# Patient Record
Sex: Female | Born: 1995 | Race: White | Hispanic: No | Marital: Married | Smoking: Never smoker
Health system: Southern US, Community
[De-identification: ages and names within clinical notes are randomized; demographics above are authoritative.]

## PROBLEM LIST (undated history)

## (undated) ENCOUNTER — Inpatient Hospital Stay: Payer: Self-pay

## (undated) DIAGNOSIS — F419 Anxiety disorder, unspecified: Secondary | ICD-10-CM

## (undated) DIAGNOSIS — M419 Scoliosis, unspecified: Secondary | ICD-10-CM

## (undated) DIAGNOSIS — M1990 Unspecified osteoarthritis, unspecified site: Secondary | ICD-10-CM

## (undated) DIAGNOSIS — G43909 Migraine, unspecified, not intractable, without status migrainosus: Secondary | ICD-10-CM

## (undated) DIAGNOSIS — G90A Postural orthostatic tachycardia syndrome (POTS): Secondary | ICD-10-CM

## (undated) DIAGNOSIS — I498 Other specified cardiac arrhythmias: Secondary | ICD-10-CM

## (undated) DIAGNOSIS — M5431 Sciatica, right side: Secondary | ICD-10-CM

## (undated) DIAGNOSIS — O99019 Anemia complicating pregnancy, unspecified trimester: Secondary | ICD-10-CM

## (undated) HISTORY — PX: ABDOMINAL HYSTERECTOMY: SHX81

## (undated) HISTORY — PX: NO PAST SURGERIES: SHX2092

---
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
## 2017-01-29 ENCOUNTER — Ambulatory Visit: Admission: RE | Admit: 2017-01-29 | Payer: Medicaid Other | Source: Ambulatory Visit

## 2017-01-29 ENCOUNTER — Other Ambulatory Visit: Payer: Self-pay | Admitting: Medical Oncology

## 2017-01-29 DIAGNOSIS — R102 Pelvic and perineal pain: Secondary | ICD-10-CM

## 2017-02-16 NOTE — L&D Delivery Note (Signed)
Date of delivery: 01/03/18 Estimated Date of Delivery: 01/19/18 Patient's last menstrual period was 04/14/2017 (approximate). EGA: [redacted]w[redacted]d  Delivery Note At 11:17 PM a viable female was delivered via Vaginal, Spontaneous (Presentation:cephalic; LOA).  APGAR: 8, 9; weight pending.   Placenta status: spontaneous, intact.  Cord: 3vv with the following complications: none apparent.  Cord pH: not collected  Anesthesia:  epidural Episiotomy: None Lacerations: None Suture Repair: n/a Est. Blood Loss (mL): 120cc (measured)  Mom presented to L&D with rupture of membranes, augmented with pitocin.  epidual placed. Progressed to complete, second stage: 2 pushes  delivery of fetal head with restitution to LOT.   Anterior then posterior shoulders delivered without difficulty.  Baby placed on mom's chest, and attended to by peds.  Cord was then clamped and cut by FOB when pulseless.  Placenta spontaneously delivered, intact.   IV pitocin given for hemorrhage prophylaxis.  We sang happy birthday to baby Kae Heller.    Mom to postpartum.  Baby to Couplet care / Skin to Skin.  Payslie Mccaig C Viet Kemmerer 01/03/2018, 11:45 PM

## 2017-02-19 ENCOUNTER — Other Ambulatory Visit: Payer: Self-pay | Admitting: Medical Oncology

## 2017-02-19 DIAGNOSIS — R102 Pelvic and perineal pain: Secondary | ICD-10-CM

## 2017-05-18 ENCOUNTER — Other Ambulatory Visit: Payer: Self-pay | Admitting: Medical Oncology

## 2017-05-18 ENCOUNTER — Ambulatory Visit
Admission: RE | Admit: 2017-05-18 | Discharge: 2017-05-18 | Disposition: A | Discharge status: Home / Self Care | Payer: Medicaid Other | Source: Ambulatory Visit | Attending: Medical Oncology | Admitting: Medical Oncology

## 2017-05-18 DIAGNOSIS — N912 Amenorrhea, unspecified: Secondary | ICD-10-CM

## 2017-05-18 DIAGNOSIS — R102 Pelvic and perineal pain: Secondary | ICD-10-CM | POA: Insufficient documentation

## 2017-05-18 DIAGNOSIS — R9389 Abnormal findings on diagnostic imaging of other specified body structures: Secondary | ICD-10-CM | POA: Diagnosis not present

## 2017-05-26 ENCOUNTER — Other Ambulatory Visit: Payer: Self-pay | Admitting: Medical Oncology

## 2017-05-26 DIAGNOSIS — Z3201 Encounter for pregnancy test, result positive: Secondary | ICD-10-CM

## 2017-05-26 DIAGNOSIS — R1031 Right lower quadrant pain: Secondary | ICD-10-CM

## 2017-06-03 ENCOUNTER — Ambulatory Visit
Admission: RE | Admit: 2017-06-03 | Discharge: 2017-06-03 | Disposition: A | Discharge status: Home / Self Care | Payer: Medicaid Other | Source: Ambulatory Visit | Attending: Medical Oncology | Admitting: Medical Oncology

## 2017-06-10 ENCOUNTER — Ambulatory Visit
Admission: RE | Admit: 2017-06-10 | Discharge: 2017-06-10 | Disposition: A | Discharge status: Home / Self Care | Payer: Medicaid Other | Source: Ambulatory Visit | Attending: Medical Oncology | Admitting: Medical Oncology

## 2017-06-10 DIAGNOSIS — Z3689 Encounter for other specified antenatal screening: Secondary | ICD-10-CM | POA: Diagnosis not present

## 2017-06-10 DIAGNOSIS — R1031 Right lower quadrant pain: Secondary | ICD-10-CM | POA: Diagnosis present

## 2017-06-10 DIAGNOSIS — Z3A01 Less than 8 weeks gestation of pregnancy: Secondary | ICD-10-CM | POA: Insufficient documentation

## 2017-06-10 DIAGNOSIS — Z3687 Encounter for antenatal screening for uncertain dates: Secondary | ICD-10-CM | POA: Diagnosis present

## 2017-06-10 DIAGNOSIS — O26891 Other specified pregnancy related conditions, first trimester: Secondary | ICD-10-CM | POA: Diagnosis not present

## 2017-06-10 DIAGNOSIS — Z3201 Encounter for pregnancy test, result positive: Secondary | ICD-10-CM | POA: Diagnosis present

## 2017-06-28 LAB — OB RESULTS CONSOLE GC/CHLAMYDIA
Chlamydia: NEGATIVE
GC PROBE AMP, GENITAL: NEGATIVE

## 2017-06-28 LAB — OB RESULTS CONSOLE HIV ANTIBODY (ROUTINE TESTING): HIV: NONREACTIVE

## 2017-06-28 LAB — OB RESULTS CONSOLE RPR: RPR: NONREACTIVE

## 2017-06-28 LAB — OB RESULTS CONSOLE VARICELLA ZOSTER ANTIBODY, IGG: Varicella: IMMUNE

## 2017-06-28 LAB — OB RESULTS CONSOLE RUBELLA ANTIBODY, IGM: RUBELLA: IMMUNE

## 2017-06-28 LAB — OB RESULTS CONSOLE HEPATITIS B SURFACE ANTIGEN: Hepatitis B Surface Ag: NEGATIVE

## 2017-06-29 ENCOUNTER — Other Ambulatory Visit: Payer: Self-pay | Admitting: Obstetrics and Gynecology

## 2017-06-29 DIAGNOSIS — Z369 Encounter for antenatal screening, unspecified: Secondary | ICD-10-CM

## 2017-07-15 ENCOUNTER — Ambulatory Visit: Payer: Medicaid Other

## 2017-07-15 ENCOUNTER — Ambulatory Visit: Payer: Medicaid Other | Attending: Obstetrics and Gynecology

## 2017-08-06 ENCOUNTER — Other Ambulatory Visit: Payer: Self-pay | Admitting: Obstetrics & Gynecology

## 2017-08-06 DIAGNOSIS — R1031 Right lower quadrant pain: Secondary | ICD-10-CM

## 2017-08-20 ENCOUNTER — Ambulatory Visit
Admission: RE | Admit: 2017-08-20 | Discharge: 2017-08-20 | Disposition: A | Discharge status: Home / Self Care | Payer: Medicaid Other | Source: Ambulatory Visit | Attending: Obstetrics & Gynecology | Admitting: Obstetrics & Gynecology

## 2017-08-20 ENCOUNTER — Other Ambulatory Visit: Payer: Self-pay | Admitting: Obstetrics & Gynecology

## 2017-08-20 DIAGNOSIS — R1031 Right lower quadrant pain: Secondary | ICD-10-CM

## 2017-08-20 DIAGNOSIS — Z3A00 Weeks of gestation of pregnancy not specified: Secondary | ICD-10-CM | POA: Diagnosis not present

## 2017-08-20 DIAGNOSIS — O26899 Other specified pregnancy related conditions, unspecified trimester: Secondary | ICD-10-CM | POA: Insufficient documentation

## 2017-09-30 ENCOUNTER — Other Ambulatory Visit: Payer: Self-pay | Admitting: Certified Nurse Midwife

## 2017-09-30 DIAGNOSIS — O09299 Supervision of pregnancy with other poor reproductive or obstetric history, unspecified trimester: Secondary | ICD-10-CM

## 2017-10-04 ENCOUNTER — Ambulatory Visit: Payer: Medicaid Other

## 2017-10-11 ENCOUNTER — Ambulatory Visit: Payer: Medicaid Other

## 2017-10-11 ENCOUNTER — Institutional Professional Consult (permissible substitution): Payer: Medicaid Other

## 2017-10-11 ENCOUNTER — Ambulatory Visit: Payer: Medicaid Other | Attending: Certified Nurse Midwife

## 2017-10-17 ENCOUNTER — Observation Stay
Admission: EM | Admit: 2017-10-17 | Discharge: 2017-10-17 | Disposition: A | Discharge status: Home / Self Care | Payer: Medicaid Other | Attending: Obstetrics and Gynecology | Admitting: Obstetrics and Gynecology

## 2017-10-17 ENCOUNTER — Encounter: Payer: Self-pay | Admitting: *Deleted

## 2017-10-17 ENCOUNTER — Other Ambulatory Visit: Payer: Self-pay

## 2017-10-17 DIAGNOSIS — F419 Anxiety disorder, unspecified: Secondary | ICD-10-CM | POA: Diagnosis not present

## 2017-10-17 DIAGNOSIS — Z79899 Other long term (current) drug therapy: Secondary | ICD-10-CM | POA: Diagnosis not present

## 2017-10-17 DIAGNOSIS — Z3A26 26 weeks gestation of pregnancy: Secondary | ICD-10-CM | POA: Insufficient documentation

## 2017-10-17 DIAGNOSIS — O99342 Other mental disorders complicating pregnancy, second trimester: Secondary | ICD-10-CM | POA: Insufficient documentation

## 2017-10-17 DIAGNOSIS — O26899 Other specified pregnancy related conditions, unspecified trimester: Secondary | ICD-10-CM | POA: Diagnosis present

## 2017-10-17 DIAGNOSIS — R300 Dysuria: Secondary | ICD-10-CM

## 2017-10-17 DIAGNOSIS — O2342 Unspecified infection of urinary tract in pregnancy, second trimester: Secondary | ICD-10-CM | POA: Diagnosis present

## 2017-10-17 LAB — URINALYSIS, ROUTINE W REFLEX MICROSCOPIC
Bacteria, UA: NONE SEEN
Bilirubin Urine: NEGATIVE
GLUCOSE, UA: NEGATIVE mg/dL
Hgb urine dipstick: NEGATIVE
KETONES UR: NEGATIVE mg/dL
Nitrite: NEGATIVE
PROTEIN: 30 mg/dL — AB
Specific Gravity, Urine: 1.027 (ref 1.005–1.030)
pH: 6 (ref 5.0–8.0)

## 2017-10-17 MED ORDER — TERBUTALINE SULFATE 1 MG/ML IJ SOLN: 0.2500 mg | Freq: Once | INTRAMUSCULAR | Status: DC

## 2017-10-17 NOTE — Discharge Summary (Signed)
Patient ID: Angela Levy, female   DOB: 03-27-1995, 22 y.o.   MRN: [id number]  Subjective   Angela Levy is a 22 y.o. female. She is at [redacted]w[redacted]d gestation. Patient's last menstrual period was 04/14/2017 (approximate). Estimated Date of Delivery: 01/19/18  Prenatal care site: [facility]   Chief complaint:c/o dysuria and inability to empty bladder starting Thursday. UA in office not c/w UTI  Some left flank pain . No fever , N/V      Maternal Medical History:       Past Medical History:  Diagnosis Date  . Anxiety   . Migraines   . Scoliosis          Past Surgical History:  Procedure Laterality Date  . NO PAST SURGERIES      No Known Allergies         Prior to Admission medications   Medication Sig Start Date End Date Taking? Authorizing Provider  Prenatal Vit-Fe Fumarate-FA (PRENATAL MULTIVITAMIN) TABS tablet Take 1 tablet by mouth daily at 12 noon. 06/16/16  Yes Coburn Knaus, [name], MD  benzocaine-Menthol (DERMOPLAST) 20-0.5 % AERO Apply 1 application topically 4 (four) times daily as needed for irritation. Patient not taking: Reported on 10/17/2017 06/16/16   Diamantina Edinger, [name], MD  docusate sodium (COLACE) 100 MG capsule Take 1 capsule (100 mg total) by mouth 2 (two) times daily. Patient not taking: Reported on 10/17/2017 06/16/16   Elie Gragert, [name], MD  HYDROcodone-acetaminophen (NORCO) 5-325 MG tablet Take 1 tablet by mouth every 6 (six) hours as needed for moderate pain. Patient not taking: Reported on 10/17/2017 06/16/16   Amyiah Gaba, [name], MD  ibuprofen (ADVIL,MOTRIN) 600 MG tablet Take 1 tablet (600 mg total) by mouth every 6 (six) hours. Patient not taking: Reported on 10/17/2017 06/16/16   Almee Pelphrey, [name], MD  norethindrone (ORTHO MICRONOR) 0.35 MG tablet Take 1 tablet (0.35 mg total) by mouth daily. Patient not taking: Reported on 10/17/2017 06/16/16   Mumin Denomme, [name], MD     Social History: She  reports  that she has never smoked. She has never used smokeless tobacco. She reports that she does not drink alcohol or use drugs.  Family History: family history is not on file.  no history of gyn cancers  Review of Systems: A full review of systems was performed and negative except as noted in the HPI.  Review of Systems: A full review of systems was performed and negativeexcept as noted in the HPI.  Eyes: no vision change  Ears: left ear pain  Oropharynx: no sore throat  Pulmonary . No shortness of breath , no hemoptysis Cardiovascular: no chest pain , no irregular heart beat  Gastrointestinal:no blood in stool . No diarrhea, no constipation Uro gynecologic: +dysuria , no pelvic pain Neurologic : no seizure , no migraines  Musculoskeletal: no muscular weakness  O:  Objective   BP (!) 103/59 (BP Location: Left Arm)   Pulse 92   Temp 98.5 F (36.9 C) (Oral)   Resp 16   Ht 5\' 7"  (1.702 m)   Wt 63 kg   LMP 04/14/2017 (Approximate)   BMI 21.77 kg/m       Results for orders placed or performed during the hospital encounter of 10/17/17 (from the past 48 hour(s))  Urinalysis, Routine w reflex microscopic   Collection Time: 10/17/17  4:02 PM  Result Value Ref Range   Color, Urine YELLOW (A) YELLOW   APPearance CLEAR (A) CLEAR   Specific Gravity,  Urine 1.027 1.005 - 1.030   pH 6.0 5.0 - 8.0   Glucose, UA NEGATIVE NEGATIVE mg/dL   Hgb urine dipstick NEGATIVE NEGATIVE   Bilirubin Urine NEGATIVE NEGATIVE   Ketones, ur NEGATIVE NEGATIVE mg/dL   Protein, ur 30 (A) NEGATIVE mg/dL   Nitrite NEGATIVE NEGATIVE   Leukocytes, UA TRACE (A) NEGATIVE   RBC / HPF 0-5 0 - 5 RBC/hpf   WBC, UA 21-50 0 - 5 WBC/hpf   Bacteria, UA NONE SEEN NONE SEEN   Squamous Epithelial / LPF 0-5 0 - 5   Mucus PRESENT     Constitutional: NAD, AAOx3  HE/ENT: extraocular movements grossly intact, moist mucous membranes CV: RRR PULM: nl respiratory effort, CTABL                                          Abd: gravid, non-tender, non-distended, soft                                                  Ext: Non-tender, Nonedmeatous                     Psych: mood appropriate, speech normal Pelvic deferred  NST: reassuring 26 weeks  t Time 15mins  A/P: 22 y.o. [redacted]w[redacted]d here for antenatal surveillance for dysuria UTI -----, no evidence of pyelonephritis   Start Macrobid bid x 7 days   Urine culture   RTC if fever increasing flank pain     Laverta Baltimore, MD Attending Obstetrician and Gynecologist [facility], Department of [place]

## 2017-10-17 NOTE — OB Triage Note (Signed)
CO having a UTI. Symptoms started Thursday with "blood in urine". Was at office on Thursday and urine was sent. Since then pain started in L flank. CO feeling like she needs to urinate constantly and burning with urination. Angela Levy

## 2017-10-17 NOTE — Progress Notes (Signed)
Patient ID: Angela Levy, female   DOB: 03-12-1995, 22 y.o.   MRN: [id number]  Angela Levy is a 22 y.o. female. She is at [redacted]w[redacted]d gestation. Patient's last menstrual period was 04/14/2017 (approximate). Estimated Date of Delivery: 01/19/18  Prenatal care site: [facility]   Chief complaint:c/o dysuria and inability to empty bladder starting Thursday. UA in office not c/w UTI  Some left flank pain . No fever , N/V      Maternal Medical History:   Past Medical History:  Diagnosis Date  . Anxiety   . Migraines   . Scoliosis     Past Surgical History:  Procedure Laterality Date  . NO PAST SURGERIES      No Known Allergies  Prior to Admission medications   Medication Sig Start Date End Date Taking? Authorizing Provider  Prenatal Vit-Fe Fumarate-FA (PRENATAL MULTIVITAMIN) TABS tablet Take 1 tablet by mouth daily at 12 noon. 06/16/16  Yes Blakeley Scheier, [name], MD  benzocaine-Menthol (DERMOPLAST) 20-0.5 % AERO Apply 1 application topically 4 (four) times daily as needed for irritation. Patient not taking: Reported on 10/17/2017 06/16/16   Donny Heffern, [name], MD  docusate sodium (COLACE) 100 MG capsule Take 1 capsule (100 mg total) by mouth 2 (two) times daily. Patient not taking: Reported on 10/17/2017 06/16/16   Holli Rengel, [name], MD  HYDROcodone-acetaminophen (NORCO) 5-325 MG tablet Take 1 tablet by mouth every 6 (six) hours as needed for moderate pain. Patient not taking: Reported on 10/17/2017 06/16/16   Adler Alton, [name], MD  ibuprofen (ADVIL,MOTRIN) 600 MG tablet Take 1 tablet (600 mg total) by mouth every 6 (six) hours. Patient not taking: Reported on 10/17/2017 06/16/16   Lynix Bonine, [name], MD  norethindrone (ORTHO MICRONOR) 0.35 MG tablet Take 1 tablet (0.35 mg total) by mouth daily. Patient not taking: Reported on 10/17/2017 06/16/16   Zakai Gonyea, [name], MD     Social History: She  reports that she has never smoked. She has never used smokeless tobacco. She  reports that she does not drink alcohol or use drugs.  Family History: family history is not on file.  no history of gyn cancers  Review of Systems: A full review of systems was performed and negative except as noted in the HPI.  Review of Systems: A full review of systems was performed and negative except as noted in the HPI.   Eyes: no vision change  Ears: left ear pain  Oropharynx: no sore throat  Pulmonary . No shortness of breath , no hemoptysis Cardiovascular: no chest pain , no irregular heart beat  Gastrointestinal:no blood in stool . No diarrhea, no constipation Uro gynecologic: +dysuria , no pelvic pain Neurologic : no seizure , no migraines    Musculoskeletal: no muscular weakness  O:  BP (!) 103/59 (BP Location: Left Arm)   Pulse 92   Temp 98.5 F (36.9 C) (Oral)   Resp 16   Ht 5\' 7"  (1.702 m)   Wt 63 kg   LMP 04/14/2017 (Approximate)   BMI 21.77 kg/m  Results for orders placed or performed during the hospital encounter of 10/17/17 (from the past 48 hour(s))  Urinalysis, Routine w reflex microscopic   Collection Time: 10/17/17  4:02 PM  Result Value Ref Range   Color, Urine YELLOW (A) YELLOW   APPearance CLEAR (A) CLEAR   Specific Gravity, Urine 1.027 1.005 - 1.030   pH 6.0 5.0 - 8.0   Glucose, UA NEGATIVE NEGATIVE mg/dL   Hgb urine  dipstick NEGATIVE NEGATIVE   Bilirubin Urine NEGATIVE NEGATIVE   Ketones, ur NEGATIVE NEGATIVE mg/dL   Protein, ur 30 (A) NEGATIVE mg/dL   Nitrite NEGATIVE NEGATIVE   Leukocytes, UA TRACE (A) NEGATIVE   RBC / HPF 0-5 0 - 5 RBC/hpf   WBC, UA 21-50 0 - 5 WBC/hpf   Bacteria, UA NONE SEEN NONE SEEN   Squamous Epithelial / LPF 0-5 0 - 5   Mucus PRESENT      Constitutional: NAD, AAOx3  HE/ENT: extraocular movements grossly intact, moist mucous membranes CV: RRR PULM: nl respiratory effort, CTABL     Abd: gravid, non-tender, non-distended, soft      Ext: Non-tender, Nonedmeatous   Psych: mood appropriate, speech normal Pelvic  deferred  NST: reassuring 26 weeks  t Time 45mins  A/P: 22 y.o. [redacted]w[redacted]d here for antenatal surveillance for dysuria UTI -----, no evidence of pyelonephritis   Start Macrobid bid x 7 days   Urine culture   RTC if fever increasing flank pain     Laverta Baltimore, MD Attending Obstetrician and Gynecologist [facility], Department of [facility]

## 2017-10-20 LAB — URINE CULTURE: Special Requests: NORMAL

## 2018-01-02 ENCOUNTER — Observation Stay
Admission: EM | Admit: 2018-01-02 | Discharge: 2018-01-03 | Disposition: A | Discharge status: Home / Self Care | Payer: Medicaid Other | Source: Home / Self Care | Admitting: Obstetrics and Gynecology

## 2018-01-02 ENCOUNTER — Other Ambulatory Visit: Payer: Self-pay

## 2018-01-02 DIAGNOSIS — O471 False labor at or after 37 completed weeks of gestation: Secondary | ICD-10-CM | POA: Insufficient documentation

## 2018-01-02 DIAGNOSIS — Z3A37 37 weeks gestation of pregnancy: Secondary | ICD-10-CM | POA: Insufficient documentation

## 2018-01-02 NOTE — OB Triage Note (Signed)
Patient arrived in triage with c/o leaking of fluid since 2145, clear with pink tinge, no odor. Reports good fetal movement. Denies contractions or vaginal bleeding. EFM applied and assessing.

## 2018-01-03 ENCOUNTER — Inpatient Hospital Stay: Payer: Medicaid Other | Admitting: Anesthesiology

## 2018-01-03 ENCOUNTER — Other Ambulatory Visit: Payer: Self-pay

## 2018-01-03 ENCOUNTER — Inpatient Hospital Stay
Admission: EM | Admit: 2018-01-03 | Discharge: 2018-01-05 | DRG: 806 | Disposition: A | Discharge status: Home / Self Care | Payer: Medicaid Other | Attending: Obstetrics & Gynecology | Admitting: Obstetrics & Gynecology

## 2018-01-03 DIAGNOSIS — O4292 Full-term premature rupture of membranes, unspecified as to length of time between rupture and onset of labor: Principal | ICD-10-CM | POA: Diagnosis present

## 2018-01-03 DIAGNOSIS — Z6791 Unspecified blood type, Rh negative: Secondary | ICD-10-CM | POA: Diagnosis not present

## 2018-01-03 DIAGNOSIS — O26893 Other specified pregnancy related conditions, third trimester: Secondary | ICD-10-CM | POA: Diagnosis present

## 2018-01-03 DIAGNOSIS — D62 Acute posthemorrhagic anemia: Secondary | ICD-10-CM | POA: Diagnosis not present

## 2018-01-03 DIAGNOSIS — O9081 Anemia of the puerperium: Secondary | ICD-10-CM | POA: Diagnosis not present

## 2018-01-03 DIAGNOSIS — Z3A37 37 weeks gestation of pregnancy: Secondary | ICD-10-CM | POA: Diagnosis not present

## 2018-01-03 LAB — CBC WITH DIFFERENTIAL/PLATELET
Abs Immature Granulocytes: 0.05 10*3/uL (ref 0.00–0.07)
Basophils Absolute: 0 10*3/uL (ref 0.0–0.1)
Basophils Relative: 0 %
EOS ABS: 0 10*3/uL (ref 0.0–0.5)
EOS PCT: 0 %
HCT: 33.1 % — ABNORMAL LOW (ref 36.0–46.0)
Hemoglobin: 10 g/dL — ABNORMAL LOW (ref 12.0–15.0)
Immature Granulocytes: 1 %
LYMPHS PCT: 16 %
Lymphs Abs: 1.6 10*3/uL (ref 0.7–4.0)
MCH: 21.6 pg — ABNORMAL LOW (ref 26.0–34.0)
MCHC: 30.2 g/dL (ref 30.0–36.0)
MCV: 71.6 fL — ABNORMAL LOW (ref 80.0–100.0)
Monocytes Absolute: 0.7 10*3/uL (ref 0.1–1.0)
Monocytes Relative: 7 %
NEUTROS PCT: 76 %
NRBC: 0 % (ref 0.0–0.2)
Neutro Abs: 7.7 10*3/uL (ref 1.7–7.7)
Platelets: 187 10*3/uL (ref 150–400)
RBC: 4.62 MIL/uL (ref 3.87–5.11)
RDW: 16.3 % — AB (ref 11.5–15.5)
WBC: 10.1 10*3/uL (ref 4.0–10.5)

## 2018-01-03 MED ORDER — LIDOCAINE HCL (PF) 1 % IJ SOLN: 30.0000 mL | INTRAMUSCULAR | Status: DC | PRN

## 2018-01-03 MED ORDER — LIDOCAINE HCL (PF) 1 % IJ SOLN
INTRAMUSCULAR | Status: AC
  Filled 2018-01-03: qty 30

## 2018-01-03 MED ORDER — FENTANYL 2.5 MCG/ML W/ROPIVACAINE 0.15% IN NS 100 ML EPIDURAL (ARMC)
EPIDURAL | Status: AC
  Filled 2018-01-03: qty 100

## 2018-01-03 MED ORDER — EPHEDRINE 5 MG/ML INJ
10.0000 mg | INTRAVENOUS | Status: DC | PRN
  Filled 2018-01-03: qty 2

## 2018-01-03 MED ORDER — ACETAMINOPHEN 500 MG PO TABS
1000.0000 mg | ORAL_TABLET | Freq: Four times a day (QID) | ORAL | Status: DC | PRN
  Administered 2018-01-04: 1000 mg via ORAL
  Filled 2018-01-03: qty 2

## 2018-01-03 MED ORDER — OXYTOCIN 40 UNITS IN LACTATED RINGERS INFUSION - SIMPLE MED
INTRAVENOUS | Status: AC
  Administered 2018-01-03: 4 m[IU]/min via INTRAVENOUS
  Filled 2018-01-03: qty 1000

## 2018-01-03 MED ORDER — OXYTOCIN 40 UNITS IN LACTATED RINGERS INFUSION - SIMPLE MED
1.0000 m[IU]/min | INTRAVENOUS | Status: DC
  Administered 2018-01-03: 4 m[IU]/min via INTRAVENOUS

## 2018-01-03 MED ORDER — TERBUTALINE SULFATE 1 MG/ML IJ SOLN: 0.2500 mg | Freq: Once | INTRAMUSCULAR | Status: DC | PRN

## 2018-01-03 MED ORDER — BUPIVACAINE HCL (PF) 0.25 % IJ SOLN
INTRAMUSCULAR | Status: DC | PRN
  Administered 2018-01-03: 10 mL via EPIDURAL
  Administered 2018-01-03: 5 mL via EPIDURAL

## 2018-01-03 MED ORDER — LACTATED RINGERS IV SOLN
500.0000 mL | INTRAVENOUS | Status: DC | PRN
  Administered 2018-01-03: 1000 mL via INTRAVENOUS

## 2018-01-03 MED ORDER — OXYTOCIN BOLUS FROM INFUSION
500.0000 mL | Freq: Once | INTRAVENOUS | Status: AC
  Administered 2018-01-03: 500 mL via INTRAVENOUS

## 2018-01-03 MED ORDER — ONDANSETRON HCL 4 MG/2ML IJ SOLN: 4.0000 mg | Freq: Four times a day (QID) | INTRAMUSCULAR | Status: DC | PRN

## 2018-01-03 MED ORDER — OXYTOCIN 40 UNITS IN LACTATED RINGERS INFUSION - SIMPLE MED
2.5000 [IU]/h | INTRAVENOUS | Status: DC
  Administered 2018-01-03: 2.5 [IU]/h via INTRAVENOUS

## 2018-01-03 MED ORDER — BUTORPHANOL TARTRATE 1 MG/ML IJ SOLN: 1.0000 mg | INTRAMUSCULAR | Status: DC | PRN

## 2018-01-03 MED ORDER — PHENYLEPHRINE 40 MCG/ML (10ML) SYRINGE FOR IV PUSH (FOR BLOOD PRESSURE SUPPORT)
80.0000 ug | PREFILLED_SYRINGE | INTRAVENOUS | Status: DC | PRN
  Filled 2018-01-03: qty 5

## 2018-01-03 MED ORDER — MISOPROSTOL 200 MCG PO TABS
ORAL_TABLET | ORAL | Status: AC
  Filled 2018-01-03: qty 4

## 2018-01-03 MED ORDER — AMMONIA AROMATIC IN INHA
RESPIRATORY_TRACT | Status: AC
  Filled 2018-01-03: qty 10

## 2018-01-03 MED ORDER — DIPHENHYDRAMINE HCL 50 MG/ML IJ SOLN: 12.5000 mg | INTRAMUSCULAR | Status: DC | PRN

## 2018-01-03 MED ORDER — OXYTOCIN 10 UNIT/ML IJ SOLN
INTRAMUSCULAR | Status: AC
  Filled 2018-01-03: qty 2

## 2018-01-03 MED ORDER — LACTATED RINGERS IV SOLN
INTRAVENOUS | Status: DC
  Administered 2018-01-03 (×2): via INTRAVENOUS

## 2018-01-03 MED ORDER — SOD CITRATE-CITRIC ACID 500-334 MG/5ML PO SOLN: 30.0000 mL | ORAL | Status: DC | PRN

## 2018-01-03 MED ORDER — FENTANYL 2.5 MCG/ML W/ROPIVACAINE 0.15% IN NS 100 ML EPIDURAL (ARMC)
12.0000 mL/h | EPIDURAL | Status: DC
  Administered 2018-01-03: 12 mL/h via EPIDURAL

## 2018-01-03 MED ORDER — LACTATED RINGERS IV SOLN: 500.0000 mL | Freq: Once | INTRAVENOUS | Status: DC

## 2018-01-03 NOTE — Discharge Summary (Signed)
37 weeks c/o leakage of fluid .   RN eval with nitrazine = negative  Irregular ctx . Cx 4 cm and unchaged after 1 hour eval . Reassuring fetal monitoring  D/C home

## 2018-01-03 NOTE — H&P (Addendum)
OB History & Physical   History of Present Illness:  Chief Complaint:   HPI:  Angela Levy is a 22 y.o. G58P1001 female at [redacted]w[redacted]d dated by Patient's last menstrual period was 04/14/2017 (approximate). Estimated Date of Delivery: 01/19/18  She presents to L&D with rupture of membranes and advanced dilation. Was evaluated last night for concern of LOF, nitrazine was negative and no cervical change. Today in office was 5cm and +for ROM  +FM, no CTX, +LOF, no VB No fevers/chils/palpitations  Pregnancy Issues: 1. Rh negative 2. Hx of IUGR and SGA newborn 3. Likely prolonged rupture of membranes.  Maternal Medical History:   Past Medical History:  Diagnosis Date  . Anxiety   . Migraines   . Scoliosis     Past Surgical History:  Procedure Laterality Date  . NO PAST SURGERIES      No Known Allergies  Prior to Admission medications   Medication Sig Start Date End Date Taking? Authorizing Provider  benzocaine-Menthol (DERMOPLAST) 20-0.5 % AERO Apply 1 application topically 4 (four) times daily as needed for irritation. Patient not taking: Reported on 10/17/2017 06/16/16   Levy, Angela Her, MD  docusate sodium (COLACE) 100 MG capsule Take 1 capsule (100 mg total) by mouth 2 (two) times daily. Patient not taking: Reported on 10/17/2017 06/16/16   Levy, Angela Her, MD  HYDROcodone-acetaminophen (NORCO) 5-325 MG tablet Take 1 tablet by mouth every 6 (six) hours as needed for moderate pain. Patient not taking: Reported on 10/17/2017 06/16/16   Levy, Angela Her, MD  ibuprofen (ADVIL,MOTRIN) 600 MG tablet Take 1 tablet (600 mg total) by mouth every 6 (six) hours. Patient not taking: Reported on 10/17/2017 06/16/16   Levy, Angela Her, MD  norethindrone (ORTHO MICRONOR) 0.35 MG tablet Take 1 tablet (0.35 mg total) by mouth daily. Patient not taking: Reported on 10/17/2017 06/16/16   Levy, Angela Her, MD  Prenatal Vit-Fe Fumarate-FA (PRENATAL MULTIVITAMIN) TABS tablet Take 1  tablet by mouth daily at 12 noon. Patient not taking: Reported on 01/02/2018 06/16/16   Levy, Angela Her, MD     Prenatal care site: [place] History: She  reports that she has never smoked. She has never used smokeless tobacco. She reports that she does not drink alcohol or use drugs.  Family History: dementia, cirrhosis, diabetes in MGM.  Mother, sister, brother = healthy  Review of Systems: A full review of systems was performed and negative except as noted in the HPI.     Physical Exam:  Vital Signs: BP 121/79 (BP Location: Left Arm)   Pulse 89   Temp 98.1 F (36.7 C) (Oral)   Resp 16   Ht 5\' 7"  (1.702 m)   Wt 71.7 kg   LMP 04/14/2017 (Approximate)   BMI 24.75 kg/m  General: no acute distress.  HEENT: normocephalic, atraumatic Heart: regular rate & rhythm.  No murmurs/rubs/gallops Lungs: clear to auscultation bilaterally, normal respiratory effort Abdomen: soft, gravid, non-tender;  EFW: 6.0 Pelvic:   External: Normal external female genitalia  Cervix: 5/75/-2 per CNM in office   Extremities: non-tender, symmetric, no edema bilaterally.  DTRs: 2+  Neurologic: Alert & oriented x 3.    Results for orders placed or performed during the hospital encounter of 01/03/18 (from the past 24 hour(s))  Type and screen [place]     Status: None (Preliminary result)   Collection Time: 01/03/18  5:22 PM  Result Value Ref Range   ABO/RH(D) PENDING  Antibody Screen PENDING    Sample Expiration      01/06/2018 Performed at [facility], [address] [place], [place] [address]     Pertinent Results:  Prenatal Labs: Blood type/Rh A negative  Antibody screen neg  Rubella Immune  Varicella Immune  RPR NR  HBsAg Neg  HIV NR  GC neg  Chlamydia neg  Genetic screening negative  1 hour GTT 123  3 hour GTT --  GBS negative   FHT: 130 mod + accels,  No decels TOCO: irregular   Cephalic by  leopolds   Assessment:  Angela Levy is a 22 y.o. G56P1001 female at [redacted]w[redacted]d with Prolonged PROM.   Plan:  1. Admit to Labor & Delivery 2. CBC, T&S, Clrs, IVF 3. GBS negative 4. Consents obtained. 5. Continuous efm/toco, Category 1 tracing 6. 5cm but not in any pattern.  Start pitocin.    ----- Angela Days, MD Attending Obstetrician and Gynecologist [facility], Department of [facility]

## 2018-01-03 NOTE — Progress Notes (Signed)
Pt discharged to home at this time with strict return precautions given regarding loss of fluid, vaginal bleeding, decreased fetal movement, and active contractions. Pt vocalized understanding.

## 2018-01-03 NOTE — Anesthesia Procedure Notes (Signed)
Epidural Patient location during procedure: OB  Staffing Anesthesiologist: Amad Mau, MD Performed: anesthesiologist   Preanesthetic Checklist Completed: patient identified, site marked, surgical consent, pre-op evaluation, timeout performed, IV checked, risks and benefits discussed and monitors and equipment checked  Epidural Patient position: sitting Prep: ChloraPrep Patient monitoring: heart rate, continuous pulse ox and blood pressure Approach: midline Location: L4-L5 Injection technique: LOR saline  Needle:  Needle type: Tuohy  Needle gauge: 18 G Needle length: 9 cm and 9 Catheter type: closed end flexible Catheter size: 20 Guage Test dose: negative and 1.5% lidocaine with Epi 1:200 K  Assessment Sensory level: T10 Events: blood not aspirated, injection not painful, no injection resistance, negative IV test and no paresthesia  Additional Notes   Patient tolerated the insertion well without complications.Reason for block:procedure for pain     

## 2018-01-03 NOTE — Anesthesia Preprocedure Evaluation (Signed)
Anesthesia Evaluation  Patient identified by MRN, date of birth, ID band Patient awake    Reviewed: Allergy & Precautions, NPO status , Patient's Chart, lab work & pertinent test results, reviewed documented beta blocker date and time   Airway Mallampati: II  TM Distance: >3 FB     Dental  (+) Chipped   Pulmonary           Cardiovascular      Neuro/Psych  Headaches, Anxiety    GI/Hepatic   Endo/Other    Renal/GU      Musculoskeletal   Abdominal   Peds  Hematology   Anesthesia Other Findings   Reproductive/Obstetrics                             Anesthesia Physical Anesthesia Plan  ASA: II  Anesthesia Plan: Epidural   Post-op Pain Management:    Induction:   PONV Risk Score and Plan:   Airway Management Planned:   Additional Equipment:   Intra-op Plan:   Post-operative Plan:   Informed Consent: I have reviewed the patients History and Physical, chart, labs and discussed the procedure including the risks, benefits and alternatives for the proposed anesthesia with the patient or authorized representative who has indicated his/her understanding and acceptance.     Plan Discussed with: CRNA  Anesthesia Plan Comments:         Anesthesia Quick Evaluation

## 2018-01-03 NOTE — Discharge Instructions (Signed)
Call or return if you have any questions or concerns. Follow up with your primary ob/gyn at your next scheduled appointment. If you notice a large gush of fluid, vaginal bleeding, decreased fetal movement, or consistent contractions, please come back for re-evaluated.

## 2018-01-04 ENCOUNTER — Encounter: Payer: Self-pay | Admitting: *Deleted

## 2018-01-04 LAB — CBC
HCT: 28.7 % — ABNORMAL LOW (ref 36.0–46.0)
Hemoglobin: 8.8 g/dL — ABNORMAL LOW (ref 12.0–15.0)
MCH: 21.7 pg — ABNORMAL LOW (ref 26.0–34.0)
MCHC: 30.7 g/dL (ref 30.0–36.0)
MCV: 70.9 fL — ABNORMAL LOW (ref 80.0–100.0)
NRBC: 0 % (ref 0.0–0.2)
Platelets: 156 10*3/uL (ref 150–400)
RBC: 4.05 MIL/uL (ref 3.87–5.11)
RDW: 16.3 % — AB (ref 11.5–15.5)
WBC: 14.9 10*3/uL — ABNORMAL HIGH (ref 4.0–10.5)

## 2018-01-04 LAB — BPAM RBC
BLOOD PRODUCT EXPIRATION DATE: 201912192359
BLOOD PRODUCT EXPIRATION DATE: 201912192359
UNIT TYPE AND RH: 600
Unit Type and Rh: 600

## 2018-01-04 LAB — TYPE AND SCREEN
ABO/RH(D): A NEG
Antibody Screen: POSITIVE
Unit division: 0
Unit division: 0

## 2018-01-04 LAB — FETAL SCREEN: FETAL SCREEN: NEGATIVE

## 2018-01-04 MED ORDER — DOCUSATE SODIUM 100 MG PO CAPS
100.0000 mg | ORAL_CAPSULE | Freq: Two times a day (BID) | ORAL | Status: DC
  Administered 2018-01-04 – 2018-01-05 (×2): 100 mg via ORAL
  Filled 2018-01-04 (×2): qty 1

## 2018-01-04 MED ORDER — BENZOCAINE-MENTHOL 20-0.5 % EX AERO: 1.0000 "application " | INHALATION_SPRAY | CUTANEOUS | Status: DC | PRN

## 2018-01-04 MED ORDER — PRENATAL MULTIVITAMIN CH
1.0000 | ORAL_TABLET | Freq: Every day | ORAL | Status: DC
  Administered 2018-01-04 – 2018-01-05 (×2): 1 via ORAL
  Filled 2018-01-04 (×2): qty 1

## 2018-01-04 MED ORDER — ACETAMINOPHEN 500 MG PO TABS
1000.0000 mg | ORAL_TABLET | Freq: Four times a day (QID) | ORAL | Status: DC | PRN
  Administered 2018-01-04 – 2018-01-05 (×5): 1000 mg via ORAL
  Filled 2018-01-04 (×6): qty 2

## 2018-01-04 MED ORDER — ONDANSETRON HCL 4 MG PO TABS: 4.0000 mg | ORAL_TABLET | ORAL | Status: DC | PRN

## 2018-01-04 MED ORDER — FERROUS SULFATE 325 (65 FE) MG PO TABS
325.0000 mg | ORAL_TABLET | Freq: Two times a day (BID) | ORAL | Status: DC
  Administered 2018-01-05: 325 mg via ORAL
  Filled 2018-01-04 (×2): qty 1

## 2018-01-04 MED ORDER — IBUPROFEN 600 MG PO TABS
ORAL_TABLET | ORAL | Status: AC
  Filled 2018-01-04: qty 1

## 2018-01-04 MED ORDER — DIBUCAINE 1 % RE OINT: 1.0000 "application " | TOPICAL_OINTMENT | RECTAL | Status: DC | PRN

## 2018-01-04 MED ORDER — WITCH HAZEL-GLYCERIN EX PADS: 1.0000 "application " | MEDICATED_PAD | CUTANEOUS | Status: DC

## 2018-01-04 MED ORDER — ONDANSETRON HCL 4 MG/2ML IJ SOLN: 4.0000 mg | INTRAMUSCULAR | Status: DC | PRN

## 2018-01-04 MED ORDER — INFLUENZA VAC SPLIT QUAD 0.5 ML IM SUSY: 0.5000 mL | PREFILLED_SYRINGE | INTRAMUSCULAR | Status: DC | PRN

## 2018-01-04 MED ORDER — IBUPROFEN 600 MG PO TABS
600.0000 mg | ORAL_TABLET | Freq: Four times a day (QID) | ORAL | Status: DC
  Administered 2018-01-04 – 2018-01-05 (×7): 600 mg via ORAL
  Filled 2018-01-04 (×6): qty 1

## 2018-01-04 MED ORDER — RHO D IMMUNE GLOBULIN 1500 UNIT/2ML IJ SOSY
300.0000 ug | PREFILLED_SYRINGE | Freq: Once | INTRAMUSCULAR | Status: AC
  Administered 2018-01-04: 300 ug via INTRAVENOUS
  Filled 2018-01-04: qty 2

## 2018-01-04 MED ORDER — COCONUT OIL OIL: 1.0000 "application " | TOPICAL_OIL | Status: DC | PRN

## 2018-01-04 MED ORDER — SIMETHICONE 80 MG PO CHEW: 80.0000 mg | CHEWABLE_TABLET | ORAL | Status: DC | PRN

## 2018-01-04 MED ORDER — DIPHENHYDRAMINE HCL 25 MG PO CAPS: 25.0000 mg | ORAL_CAPSULE | Freq: Four times a day (QID) | ORAL | Status: DC | PRN

## 2018-01-04 NOTE — Progress Notes (Signed)
Post Partum Day 1  Subjective: Doing well. Ambulating without difficulty, pain managed with PO meds, tolerating regular diet, and voiding without difficulty. Has some numbness in left calf, but this has been improving since delivery and epidural removal.   No fever/chills, chest pain, shortness of breath, nausea/vomiting, or leg pain. No nipple or breast pain.   Objective: BP (!) 105/59 (BP Location: Right Arm)   Pulse 74   Temp 98.4 F (36.9 C) (Oral)   Resp 18   Ht 5\' 7"  (1.702 m)   Wt 71.7 kg   LMP 04/14/2017 (Approximate)   SpO2 97%   Breastfeeding? Unknown   BMI 24.75 kg/m    Physical Exam:  General: alert, cooperative, appears stated age and no distress Breasts: soft/nontender CV: RRR Pulm: nl effort, CTABL Abdomen: soft, non-tender, active bowel sounds Uterine Fundus: firm Lochia: appropriate DVT Evaluation: No evidence of DVT seen on physical exam. No cords or calf tenderness. No significant calf/ankle edema.  Recent Labs    01/03/18 1722 01/04/18 0609  HGB 10.0* 8.8*  HCT 33.1* 28.7*  WBC 10.1 14.9*  PLT 187 156    Assessment/Plan: 22 y.o. G2P2002 postpartum day # 1  -Continue routine postpartum care -Encouraged snug fitting bra, cold application, Tylenol PRN, and cabbage leaves for engorgement for formula feeding. -Acute blood loss anemia - hemodynamically stable and asymptomatic; start PO ferrous sulfate BID with stool softeners  -Immunization status: Offer flu prior to discharge, other immunizations up to date  Disposition: Continue inpatient postpartum care    LOS: 1 day   Lisette Grinder, CNM 01/04/2018, 8:34 AM   ----- Lisette Grinder Certified Nurse Midwife [facility]

## 2018-01-04 NOTE — Discharge Summary (Signed)
Obstetrical Discharge Summary  Patient Name: Angela Levy DOB: 12/27/1995 MRN: [id number]  Date of Admission: 01/03/2018 Date of Delivery: 01/03/18 Delivered by: Larey Days, MD Date of Discharge: 01/05/2018  Primary OB: [facility]  FOY:DXAJOIN'O last menstrual period was 04/14/2017 (approximate). EDC Estimated Date of Delivery: 01/19/18 Gestational Age at Delivery: [redacted]w[redacted]d   Antepartum complications:  1. Rh negative 2. Hx of IUGR and SGA infant 3. Likely prolonged rupture of membranes   Admitting Diagnosis: premature rupture of membranes, prolonged rupture of membranes  Secondary Diagnosis: Patient Active Problem List   Diagnosis Date Noted  . Labor and delivery, indication for care 01/03/2018  . Labor and delivery indication for care or intervention 01/03/2018  . Dysuria during pregnancy 10/17/2017  . IUGR (intrauterine growth restriction) affecting care of mother 06/13/2016  . Hx of esophagitis 06/08/2014  . Hx of tinea corporis 06/08/2014    Augmentation: Pitocin Complications: None Intrapartum complications/course:  Date of Delivery:  Delivered By: Larey Days Delivery Type: spontaneous vaginal delivery Anesthesia: epidural Placenta: spontaneous Laceration: none Episiotomy: none Newborn Data: Live born female "Kae Heller" Birth Weight: 7 lb 2.6 oz (3250 g) APGAR: 8, 9  Newborn Delivery   Birth date/time:  01/03/2018 23:17:00 Delivery type:  Vaginal, Spontaneous     Postpartum Procedures: flu shot offered  Post partum course:  Patient had an uncomplicated postpartum course.  By time of discharge on PPD#1, her pain was controlled on oral pain medications; she had appropriate lochia and was ambulating, voiding without difficulty and tolerating regular diet.  She was deemed stable for discharge to home.     Discharge Physical Exam:  BP 112/73 (BP Location: Right Arm)   Pulse (!) 59   Temp 98.1 F (36.7 C) (Oral)   Resp 20   Ht 5\' 7"  (1.702 m)    Wt 71.7 kg   LMP 04/14/2017 (Approximate)   SpO2 98%   Breastfeeding? Unknown   BMI 24.75 kg/m   General: NAD CV: RRR Pulm: CTABL, nl effort ABD: s/nd/nt, fundus firm and below the umbilicus Lochia: moderate DVT Evaluation: LE non-ttp, no evidence of DVT on exam.  Hemoglobin  Date Value Ref Range Status  01/04/2018 8.8 (L) 12.0 - 15.0 g/dL Final    Comment:    Reticulocyte Hemoglobin testing may be clinically indicated, consider ordering this additional test [id number]    HGB  Date Value Ref Range Status  12/09/2012 12.5 12.0 - 16.0 g/dL Final   HCT  Date Value Ref Range Status  01/04/2018 28.7 (L) 36.0 - 46.0 % Final  12/09/2012 37.0 35.0 - 47.0 % Final     Disposition: stable, discharge to home. Baby Feeding: breastmilk andformula Baby Disposition: home with mom  Rh Immune globulin given: n/a Rubella vaccine given: n/a Tdap vaccine given in AP or PP setting: AP Flu vaccine given in AP or PP setting: declined AP, offered PP  Contraception: OCPs vs Depo, depo offered @ discharge  Prenatal Labs:  Blood type/Rh A negative  Antibody screen neg  Rubella Immune  Varicella Immune  RPR NR  HBsAg Neg  HIV NR  GC neg  Chlamydia neg  Genetic screening negative  1 hour GTT 123  3 hour GTT --  GBS negative    Plan:  Ted Leonhart Wheatley was discharged to home in good condition. Follow-up appointment with Dr. Leonides Schanz in 6 weeks.  Discharge Medications: Allergies as of 01/05/2018   No Known Allergies     Medication List    STOP taking these  medications   HYDROcodone-acetaminophen 5-325 MG tablet Commonly known as:  NORCO/VICODIN     TAKE these medications   benzocaine-Menthol 20-0.5 % Aero Commonly known as:  DERMOPLAST Apply 1 application topically 4 (four) times daily as needed for irritation.   docusate sodium 100 MG capsule Commonly known as:  COLACE Take 1 capsule (100 mg total) by mouth 2 (two) times daily for 14 days. To keep stools soft, as  needed What changed:  additional instructions   ferrous sulfate 325 (65 FE) MG tablet Take 1 tablet (325 mg total) by mouth daily with breakfast. Take with Vitamin C   ibuprofen 800 MG tablet Commonly known as:  ADVIL,MOTRIN Take 1 tablet (800 mg total) by mouth every 8 (eight) hours as needed for moderate pain or cramping. What changed:    medication strength  how much to take  when to take this  reasons to take this   norethindrone 0.35 MG tablet Commonly known as:  MICRONOR,CAMILA,ERRIN Take 1 tablet (0.35 mg total) by mouth daily.   prenatal multivitamin Tabs tablet Take 1 tablet by mouth daily at 12 noon.         Signed: [name] 01/05/18

## 2018-01-04 NOTE — Anesthesia Postprocedure Evaluation (Signed)
Anesthesia Post Note  Patient: Angela Levy  Procedure(s) Performed: AN AD HOC LABOR EPIDURAL  Patient location during evaluation: Mother Baby Anesthesia Type: Epidural Level of consciousness: awake, awake and alert and oriented Pain management: pain level controlled Vital Signs Assessment: post-procedure vital signs reviewed and stable Respiratory status: spontaneous breathing and nonlabored ventilation Cardiovascular status: stable Postop Assessment: no headache, no backache, no apparent nausea or vomiting, patient able to bend at knees, adequate PO intake and able to ambulate Anesthetic complications: no     Last Vitals:  Vitals:   01/04/18 0428 01/04/18 0836  BP: (!) 105/59 114/75  Pulse: 74 87  Resp: 18 18  Temp: 36.9 C (!) 36.3 C  SpO2: 97% 99%    Last Pain:  Vitals:   01/04/18 0836  TempSrc: Oral  PainSc:                  Ricki Miller

## 2018-01-05 LAB — RHOGAM INJECTION: UNIT DIVISION: 0

## 2018-01-05 LAB — RPR: RPR: NONREACTIVE

## 2018-01-05 MED ORDER — DOCUSATE SODIUM 100 MG PO CAPS: 100.0000 mg | ORAL_CAPSULE | Freq: Two times a day (BID) | ORAL | 0 refills | Status: AC

## 2018-01-05 MED ORDER — IBUPROFEN 800 MG PO TABS: 800.0000 mg | ORAL_TABLET | Freq: Three times a day (TID) | ORAL | 1 refills | Status: DC | PRN

## 2018-01-05 MED ORDER — FERROUS SULFATE 325 (65 FE) MG PO TABS: 325.0000 mg | ORAL_TABLET | Freq: Every day | ORAL | 1 refills | Status: DC

## 2018-01-05 NOTE — Discharge Instructions (Signed)
Care After Vaginal Delivery Congratulations on your new baby!!  Refer to this sheet in the next few weeks. These discharge instructions provide you with information on caring for yourself after delivery. Your caregiver may also give you specific instructions. Your treatment has been planned according to the most current medical practices available, but problems sometimes occur. Call your caregiver if you have any problems or questions after you go home.  HOME CARE INSTRUCTIONS  Take over-the-counter or prescription medicines only as directed by your caregiver or pharmacist.  Do not drink alcohol, especially if you are breastfeeding or taking medicine to relieve pain.  Do not chew or smoke tobacco.  Do not use illegal drugs.  Continue to use good perineal care. Good perineal care includes:  Wiping your perineum from front to back.  Keeping your perineum clean.  Do not use tampons or douche until your caregiver says it is okay.  Shower, wash your hair, and take tub baths as directed by your caregiver.  Wear a well-fitting bra that provides breast support.  Eat healthy foods.  Drink enough fluids to keep your urine clear or pale yellow.  Eat high-fiber foods such as whole grain cereals and breads, brown rice, beans, and fresh fruits and vegetables every day. These foods may help prevent or relieve constipation.  Follow your caregiver's recommendations regarding resumption of activities such as climbing stairs, driving, lifting, exercising, or traveling. Specifically, no driving for two weeks, so that you are comfortable reacting quickly in an emergency.  Talk to your caregiver about resuming sexual activities. Resumption of sexual activities is dependent upon your risk of infection, your rate of healing, and your comfort and desire to resume sexual activity. Usually we recommend waiting about six weeks, or until your bleeding stops and you are interested in sex.  Try to have someone  help you with your household activities and your newborn for at least a few days after you leave the hospital. Even longer is better.  Rest as much as possible. Try to rest or take a nap when your newborn is sleeping. Sleep deprivation can be very hard after delivery.  Increase your activities gradually.  Keep all of your scheduled postpartum appointments. It is very important to keep your scheduled follow-up appointments. At these appointments, your caregiver will be checking to make sure that you are healing physically and emotionally.  SEEK MEDICAL CARE IF:   You are passing large clots from your vagina.   You have a foul smelling discharge from your vagina.  You have trouble urinating.  You are urinating frequently.  You have pain when you urinate.  You have a change in your bowel movements.  You have increasing redness, pain, or swelling near your vaginal incision (episiotomy) or vaginal tear.  You have pus draining from your episiotomy or vaginal tear.  Your episiotomy or vaginal tear is separating.  You have painful, hard, or reddened breasts.  You have a severe headache.  You have blurred vision or see spots.  You feel sad or depressed.  You have thoughts of hurting yourself or your newborn.  You have questions about your care, the care of your newborn, or medicines.  You are dizzy or light-headed.  You have a rash.  You have nausea or vomiting.  You were breastfeeding and have not had a menstrual period within 12 weeks after you stopped breastfeeding.  You are not breastfeeding and have not had a menstrual period by the 12th week after delivery.  You  have a fever.  SEEK IMMEDIATE MEDICAL CARE IF:   You have persistent pain.  You have chest pain.  You have shortness of breath.  You faint.  You have leg pain.  You have stomach pain.  Your vaginal bleeding saturates two or more sanitary pads in 1 hour.  MAKE SURE YOU:   Understand these  instructions.  Will get help right away if you are not doing well or get worse.   Document Released: 01/31/2000 Document Revised: 06/19/2013 Document Reviewed: 09/30/2011  [facility] Patient Information 2015 [place], [place]. This information is not intended to replace advice given to you by your health care provider. Make sure you discuss any questions you have with your health care provider.

## 2018-01-05 NOTE — Progress Notes (Signed)
Patient discharged home with infant. Discharge instructions, prescriptions and follow up appointment given to and reviewed with patient. Patient verbalized understanding.  Patient discharged, but infant was not. Parents left AMA with infant. (see infants chart)

## 2019-02-23 IMAGING — US US OB < 14 WEEKS - US OB TV
1 series · 14 of 28 positions shown · non-contrast
Comparison: None.

CLINICAL DATA: Pregnant patient with pelvic pain.

EXAM:
OBSTETRIC <14 WK US AND TRANSVAGINAL OB US
TECHNIQUE: Both transabdominal and transvaginal ultrasound examinations were
performed for complete evaluation of the gestation as well as the
maternal uterus, adnexal regions, and pelvic cul-de-sac.
Transvaginal technique was performed to assess early pregnancy.

[Series 1: us ob < 14 weeks - us ob tv · 0.22mm/px · 14 of 125 slices shown]
[im 5/125]
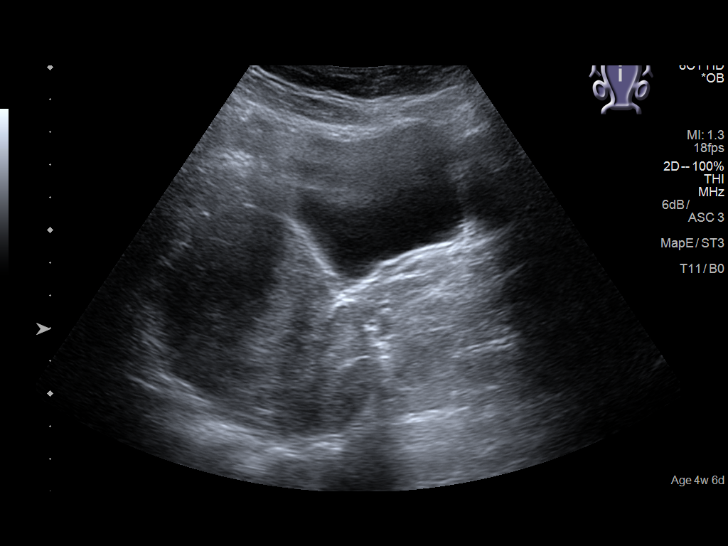
[im 14/125]
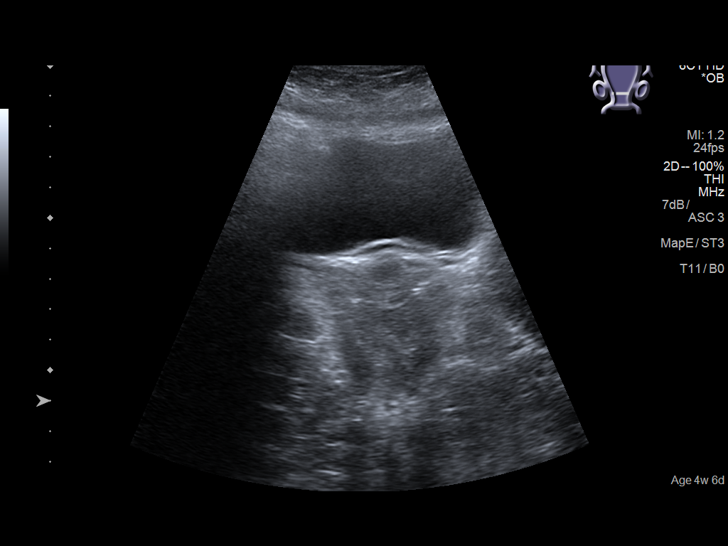
[im 23/125]
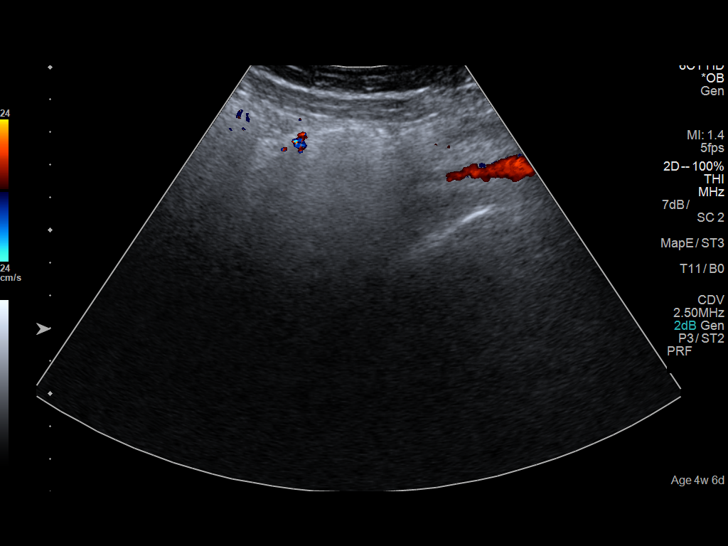
[im 33/125]
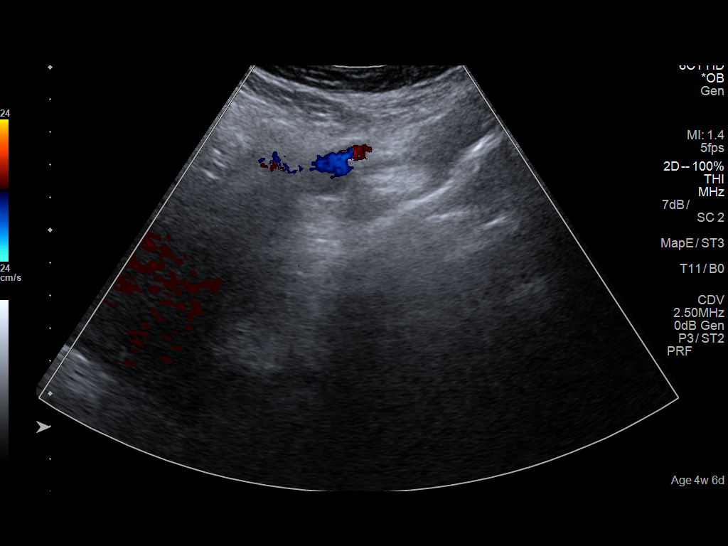
[im 42/125]
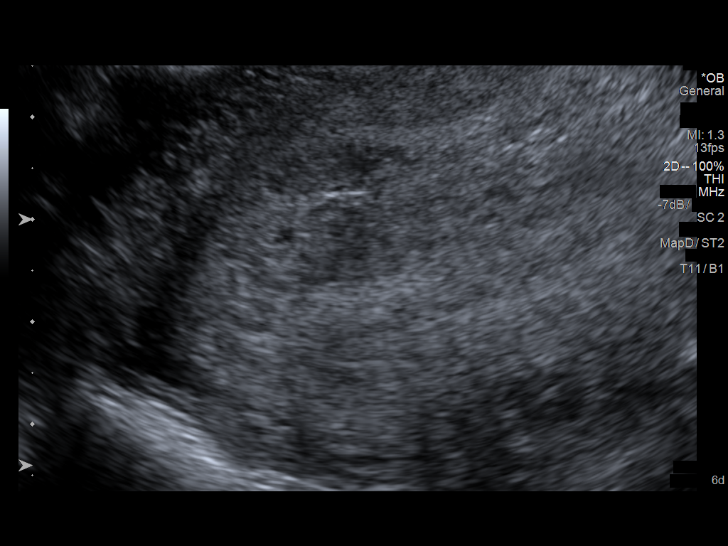
[im 51/125]
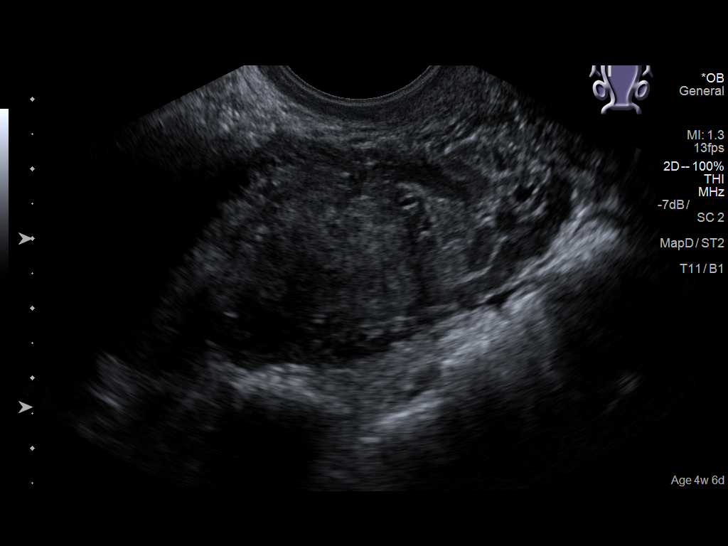
[im 60/125]
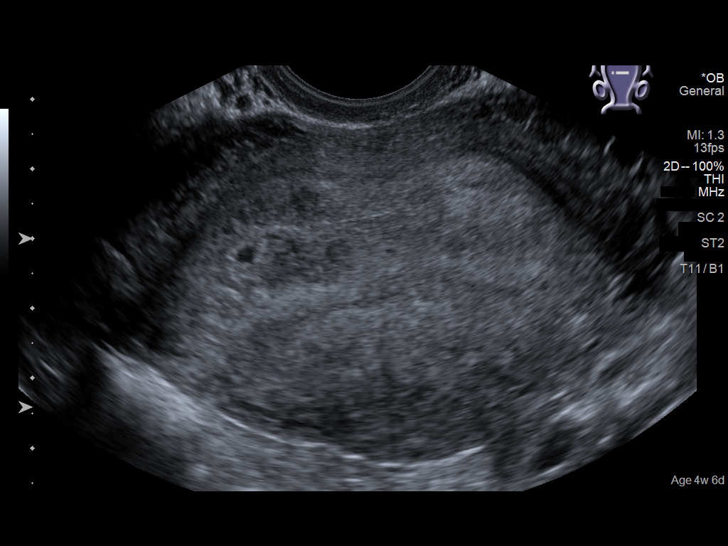
[im 69/125]
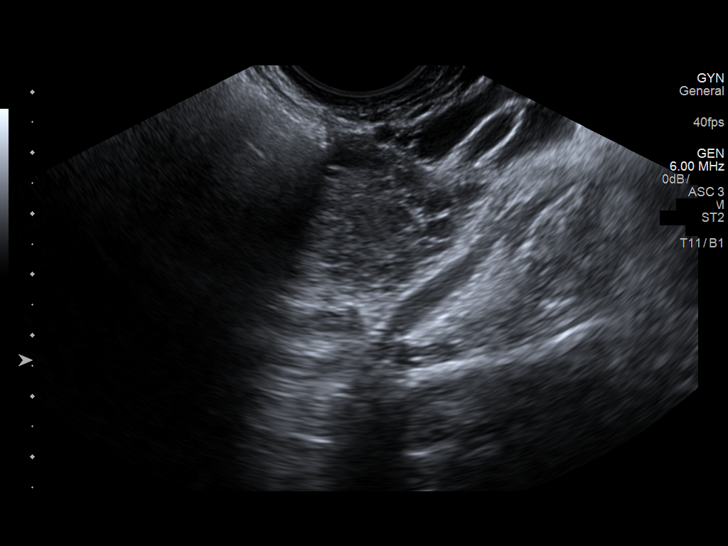
[im 79/125]
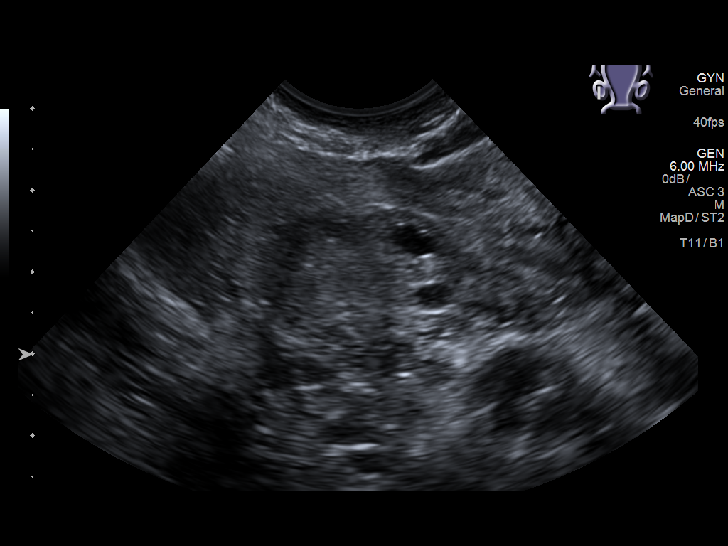
[im 88/125]
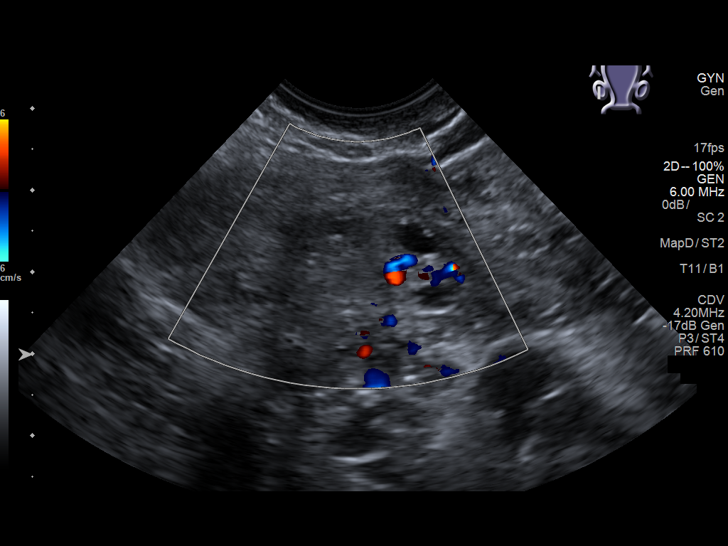
[im 97/125]
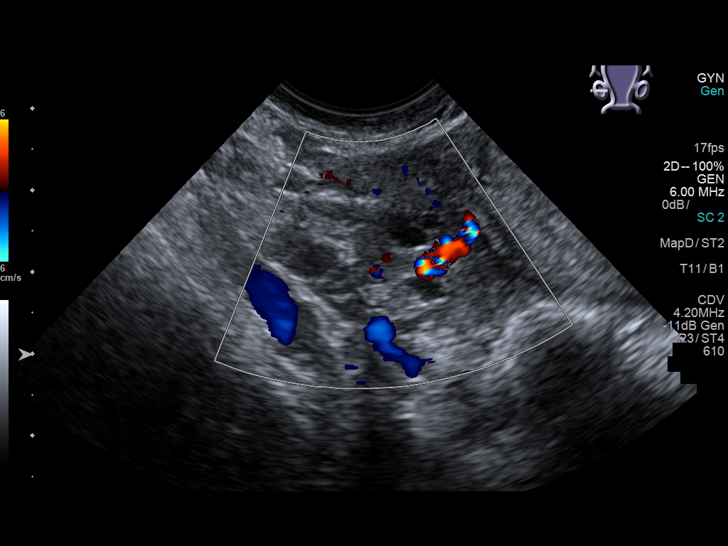
[im 106/125]
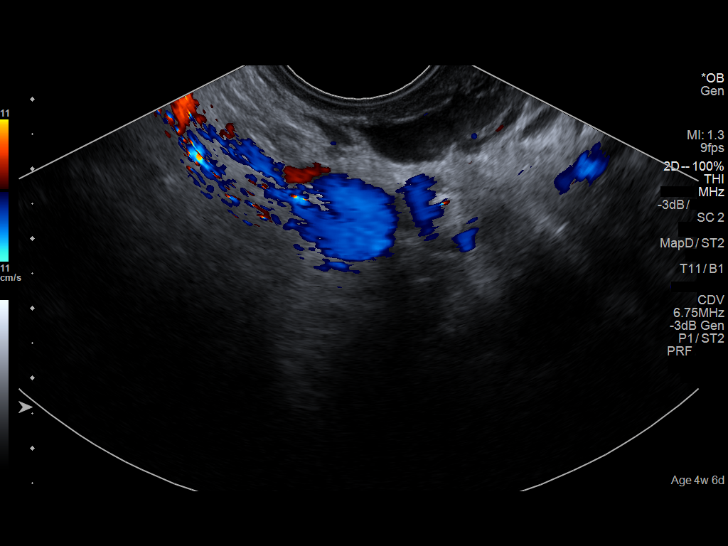
[im 115/125]
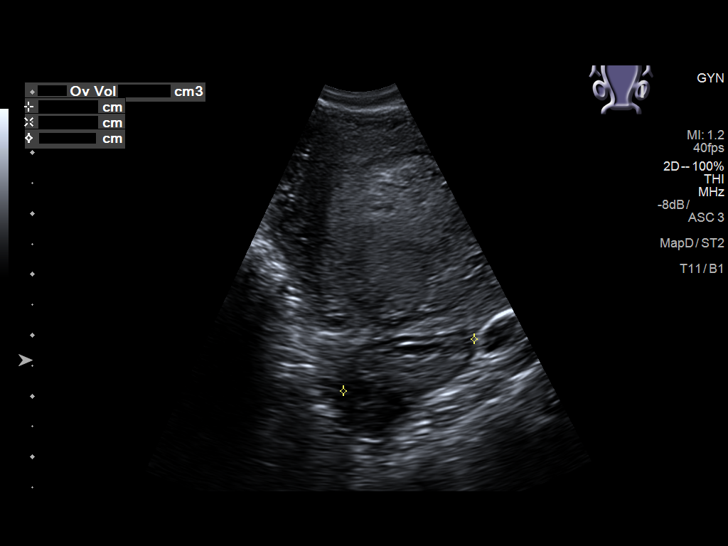
[im 125/125]
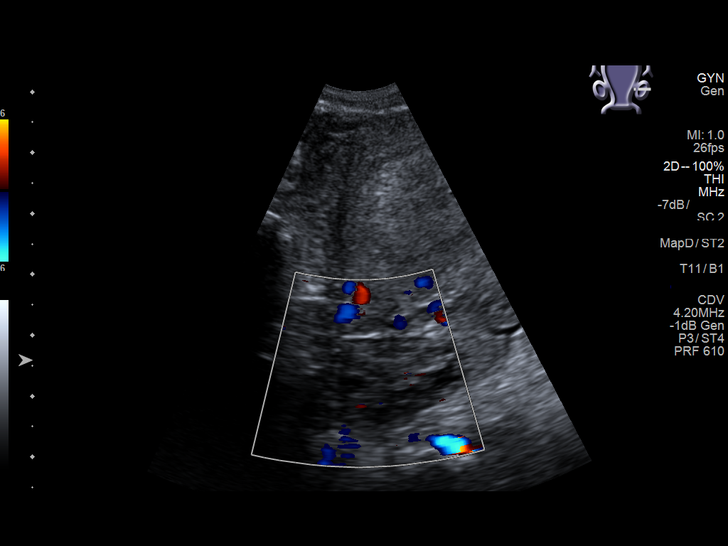

[14 of 28 positions shown; findings below may reference images not displayed]

FINDINGS: Intrauterine gestational sac: Single

Yolk sac:  Not Visualized.

Embryo:  Not Visualized.

Cardiac Activity: Not Visualized.

MSD: 3.8 mm   5 w   1 d

Subchorionic hemorrhage:  None visualized.

Maternal uterus/adnexae: Probable corpus luteum right ovary. Normal
left ovary. No free fluid in the pelvis.
IMPRESSION: Probable early intrauterine gestational sac, but no yolk sac, fetal
pole, or cardiac activity yet visualized. Recommend follow-up
quantitative B-HCG levels and follow-up US in 14 days to assess
viability. This recommendation follows SRU consensus guidelines:
Diagnostic Criteria for Nonviable Pregnancy Early in the First
Trimester. N Engl J Med 7287; [DATE].

## 2019-09-20 DIAGNOSIS — Z3493 Encounter for supervision of normal pregnancy, unspecified, third trimester: Secondary | ICD-10-CM | POA: Insufficient documentation

## 2019-09-20 LAB — OB RESULTS CONSOLE HIV ANTIBODY (ROUTINE TESTING): HIV: NONREACTIVE

## 2019-09-20 LAB — OB RESULTS CONSOLE GC/CHLAMYDIA
Chlamydia: NEGATIVE
Gonorrhea: NEGATIVE

## 2019-09-20 LAB — OB RESULTS CONSOLE HEPATITIS B SURFACE ANTIGEN: Hepatitis B Surface Ag: NEGATIVE

## 2019-09-20 LAB — OB RESULTS CONSOLE RUBELLA ANTIBODY, IGM: Rubella: IMMUNE

## 2019-09-20 LAB — OB RESULTS CONSOLE VARICELLA ZOSTER ANTIBODY, IGG: Varicella: IMMUNE

## 2019-09-20 LAB — OB RESULTS CONSOLE RPR: RPR: NONREACTIVE

## 2020-01-17 ENCOUNTER — Observation Stay
Admission: EM | Admit: 2020-01-17 | Discharge: 2020-01-17 | Disposition: A | Discharge status: Home / Self Care | Payer: Medicaid Other | Attending: Obstetrics & Gynecology | Admitting: Obstetrics & Gynecology

## 2020-01-17 ENCOUNTER — Other Ambulatory Visit: Payer: Self-pay

## 2020-01-17 DIAGNOSIS — O26892 Other specified pregnancy related conditions, second trimester: Principal | ICD-10-CM | POA: Insufficient documentation

## 2020-01-17 DIAGNOSIS — R102 Pelvic and perineal pain: Secondary | ICD-10-CM | POA: Diagnosis not present

## 2020-01-17 DIAGNOSIS — Z3A28 28 weeks gestation of pregnancy: Secondary | ICD-10-CM | POA: Insufficient documentation

## 2020-01-17 DIAGNOSIS — O4700 False labor before 37 completed weeks of gestation, unspecified trimester: Secondary | ICD-10-CM | POA: Diagnosis present

## 2020-01-17 HISTORY — DX: Sciatica, right side: M54.31

## 2020-01-17 LAB — URINALYSIS, COMPLETE (UACMP) WITH MICROSCOPIC
Bilirubin Urine: NEGATIVE
Glucose, UA: NEGATIVE mg/dL
Hgb urine dipstick: NEGATIVE
Ketones, ur: NEGATIVE mg/dL
Nitrite: NEGATIVE
Protein, ur: NEGATIVE mg/dL
Specific Gravity, Urine: 1.02 (ref 1.005–1.030)
pH: 6 (ref 5.0–8.0)

## 2020-01-17 LAB — WET PREP, GENITAL
Clue Cells Wet Prep HPF POC: NONE SEEN
Sperm: NONE SEEN
Trich, Wet Prep: NONE SEEN
Yeast Wet Prep HPF POC: NONE SEEN

## 2020-01-17 LAB — FETAL FIBRONECTIN: Fetal Fibronectin: NEGATIVE

## 2020-01-17 MED ORDER — LACTATED RINGERS IV BOLUS
500.0000 mL | Freq: Once | INTRAVENOUS | Status: AC
  Administered 2020-01-17: 500 mL via INTRAVENOUS

## 2020-01-17 MED ORDER — LACTATED RINGERS IV SOLN: INTRAVENOUS | Status: DC

## 2020-01-17 MED ORDER — TERBUTALINE SULFATE 1 MG/ML IJ SOLN: 0.2500 mg | Freq: Once | INTRAMUSCULAR | Status: DC | PRN

## 2020-01-17 MED ORDER — BETAMETHASONE SOD PHOS & ACET 6 (3-3) MG/ML IJ SUSP: 12.0000 mg | INTRAMUSCULAR | Status: DC

## 2020-01-17 NOTE — OB Triage Note (Signed)
Pt G3P2 [redacted]w[redacted]d presents to L&D with complaints of contractions and decreased fetal movement. Patient states contractions started yesterday morning and have gotten more uncomfortable over the past couple days. She states that they are not very painful, but she was concerned that she has had them since yesterday morning. Patient reports having sex the night before contractions began. Patient reports no LOF/vaginal bleeding, but does report a mucous discharge that began this morning. Patient states that the only concern this pregnancy was baby's growth and that she is set up for an ultrasound on 01/29/20. Patient states she has urgency/frequency with urination, but no burning. VSS. Monitors applied and assessing.

## 2020-01-17 NOTE — Progress Notes (Signed)
RN at bedside for 45 minutes readjusting U/S. Another RN attempting to get IV access per provider order.

## 2020-01-23 NOTE — Discharge Summary (Addendum)
Angela Levy is a 24 y.o. female. She is at [redacted]w[redacted]d gestation. Patient's last menstrual period was 07/05/2019. Estimated Date of Delivery: 04/08/20  Prenatal care site: [facility]   Chief complaint: low pelvic pain  Location: low pelvis, midline Onset/timing: earlier today Duration: a few hours Quality: crampy Severity: mild to moderate Aggravating or alleviating conditions: nothing makes better or worse Associated signs/symptoms: no VB.no LOF,  Active fetal movement, no discharge, intercourse >24hrs ago,  Context:  Patient developed cramping over the course of the day, her previous pregnancy delivered preterm and she had similar cramping and was found to be in labor.  She was concerned the same thing was happening here.  She is not in distress, just concerned.    Maternal Medical History:   Past Medical History:  Diagnosis Date  . Anxiety   . Migraines   . Sciatica of right side   . Scoliosis     Past Surgical History:  Procedure Laterality Date  . NO PAST SURGERIES      No Known Allergies  Prior to Admission medications   Medication Sig Start Date End Date Taking? Authorizing Provider  acetaminophen (TYLENOL) 500 MG tablet Take 500 mg by mouth every 6 (six) hours as needed.   Yes [provider]  Prenatal Vit-Fe Fumarate-FA (PRENATAL MULTIVITAMIN) TABS tablet Take 1 tablet by mouth daily at 12 noon. 06/16/16  Yes Schermerhorn, [name], MD  benzocaine-Menthol (DERMOPLAST) 20-0.5 % AERO Apply 1 application topically 4 (four) times daily as needed for irritation. Patient not taking: Reported on 10/17/2017 06/16/16   Schermerhorn, [name], MD  ferrous sulfate 325 (65 FE) MG tablet Take 1 tablet (325 mg total) by mouth daily with breakfast. Take with Vitamin C 01/05/18 03/06/18  [name], MD  ibuprofen (ADVIL,MOTRIN) 800 MG tablet Take 1 tablet (800 mg total) by mouth every 8 (eight) hours as needed for moderate pain or cramping. 01/05/18   [name], MD  norethindrone (ORTHO MICRONOR) 0.35 MG tablet Take 1 tablet (0.35 mg total) by mouth daily. Patient not taking: Reported on 10/17/2017 06/16/16   Schermerhorn, [name], MD     Social History: She  reports that she has never smoked. She has never used smokeless tobacco. She reports that she does not drink alcohol and does not use drugs.  Family History: no history of gyn cancers  Review of Systems: A full review of systems was performed and negative except as noted in the HPI.     O:  BP 101/65 (BP Location: Left Arm)   Pulse 87   Temp 98.4 F (36.9 C) (Oral)   Resp 16   Ht 5\' 7"  (1.702 m)   Wt 70.3 kg   LMP 07/05/2019   BMI 24.28 kg/m  No results found for this or any previous visit (from the past 48 hour(s)).   Constitutional: NAD, AAOx3  HE/ENT: extraocular movements grossly intact, moist mucous membranes CV: RRR PULM: nl respiratory effort, CTABL     Abd: gravid, non-tender, non-distended, soft      Ext: Non-tender, Nonedmeatous   Psych: mood appropriate, speech normal Pelvic C/L/H  NST:  Baseline: 145 Variability: moderate Accelerations present x >2 Decelerations absent Time 57mins    Assessment: 24 y.o. [redacted]w[redacted]d here for antenatal surveillance during pregnancy.  Principle diagnosis:  Rule out preterm labor, [redacted]wks gestational age in pregnancy  Plan:  Labor: not present.   Fetal Wellbeing: Reassuring Cat 1 tracing.  Reactive NST   D/c home stable,  precautions reviewed, follow-up as scheduled.   ----- Larey Days, MD Attending Obstetrician and Gynecologist [facility] Department of [facility]

## 2020-02-17 NOTE — L&D Delivery Note (Signed)
Date of delivery: 03/21/2020 Estimated Date of Delivery: 04/08/20 Patient's last menstrual period was 07/05/2019. EGA: [redacted]w[redacted]d  Delivery Note At 11:35 PM a viable female was delivered via Vaginal, Spontaneous (Presentation: Left Occiput Anterior).  APGAR: 8, 9; weight pending skin to skin.   Placenta status: Spontaneous, Intact.  Cord: 3 vessels with the following complications: None. Nuchal, loose x1, reduced after delivery.  Cord pH: not collected  Anesthesia: Epidural Episiotomy: None Lacerations: None Suture Repair: n/a Est. Blood Loss (mL): 0 cc  (actual)   Mom presented to L&D with labor.  epidual placed. AROM'd for clear fluid, Progressed to complete, second stage: <10 minutes. delivery of fetal head with restitution to LOT. Nuchal cord attempted to be reduced but abandoned.  Anterior then posterior shoulders delivered without difficulty. Nuchal reduced. Baby placed on mom's chest, and attended to by peds.   Cord was then clamped and cut when pulseless.  Placenta spontaneously delivered, intact.   IV pitocin given for hemorrhage prophylaxis.  We sang happy birthday to baby Angela Levy.    Mom to postpartum.  Baby to Couplet care / Skin to Skin.  Angela Levy Angela Levy 03/21/2020, 11:49 PM

## 2020-02-19 ENCOUNTER — Observation Stay
Admission: EM | Admit: 2020-02-19 | Discharge: 2020-02-19 | Disposition: A | Discharge status: Home / Self Care | Payer: Medicaid Other | Attending: Obstetrics and Gynecology | Admitting: Obstetrics and Gynecology

## 2020-02-19 ENCOUNTER — Other Ambulatory Visit: Payer: Self-pay

## 2020-02-19 ENCOUNTER — Encounter: Payer: Self-pay | Admitting: Obstetrics and Gynecology

## 2020-02-19 DIAGNOSIS — Z3A33 33 weeks gestation of pregnancy: Secondary | ICD-10-CM | POA: Diagnosis not present

## 2020-02-19 DIAGNOSIS — O479 False labor, unspecified: Secondary | ICD-10-CM | POA: Diagnosis present

## 2020-02-19 LAB — WET PREP, GENITAL
Clue Cells Wet Prep HPF POC: NONE SEEN
Sperm: NONE SEEN
Trich, Wet Prep: NONE SEEN
Yeast Wet Prep HPF POC: NONE SEEN

## 2020-02-19 NOTE — Discharge Summary (Signed)
Patient ID: Angela Levy MRN: [id number] DOB/AGE: 10-Apr-1995 24 y.o.  Admit date: 02/19/2020 Discharge date: 02/19/2020  Admission Diagnoses: Braxton Hicks contractions 1/hour.  Also feeling constant pressure when baby move his head.  She is requesting an SVE.  Denies pain, VB, LOF and reports GFM.   Recent SVE in the office close/soft/low.  Discharge Diagnoses: Angela Levy  Prenatal Procedures: none  Consults: None  Significant Diagnostic Studies:  Results for orders placed or performed during the hospital encounter of 02/19/20 (from the past 168 hour(s))  Wet prep, genital   Collection Time: 02/19/20  1:03 PM   Specimen: Vaginal  Result Value Ref Range   Yeast Wet Prep HPF POC NONE SEEN NONE SEEN   Trich, Wet Prep NONE SEEN NONE SEEN   Clue Cells Wet Prep HPF POC NONE SEEN NONE SEEN   WBC, Wet Prep HPF POC FEW (A) NONE SEEN   Sperm NONE SEEN     Treatments: none  Hospital Course:  25 y.o. [id number] with IUP at [redacted]w[redacted]d admitted seen because she wants her cervix checked d/t sporadic BH UCs. Wet prep collected - neg SVE 1/50/-2 Next PNV 02/29/20 - encouraged to come into the office this week for a recheck. She was deemed stable for discharge to home with outpatient follow up.  Discharge Physical Exam:  BP 111/70 (BP Location: Left Arm)   Pulse (!) 101   Temp 98 F (36.7 C) (Oral)   Resp 15   LMP 07/05/2019   General: NAD CV: RRR Pulm: CTABL, nl effort ABD: s/nd/nt, gravid DVT Evaluation: LE non-ttp, no evidence of DVT on exam.  NST: FHR baseline: 135 bpm Variability: moderate Accelerations: yes Decelerations: none Time: 20 minutes Category/reactivity: appropriate for GA  TOCO: quiet Dilation: Closed Effacement (%): 50 Cervical Position: Anterior Station: -2 Presentation: Vertex Exam by:: Angela Levy, CNM   Discharge Condition: Stable  Disposition:  Discharge disposition: 01-Home or Self Care        Allergies as of 02/19/2020   No Known Allergies      Medication List    STOP taking these medications   acetaminophen 500 MG tablet Commonly known as: TYLENOL   benzocaine-Menthol 20-0.5 % Aero Commonly known as: DERMOPLAST   ibuprofen 800 MG tablet Commonly known as: ADVIL   norethindrone 0.35 MG tablet Commonly known as: Ortho Micronor     TAKE these medications   ferrous sulfate 325 (65 FE) MG tablet Take 1 tablet (325 mg total) by mouth daily with breakfast. Take with Vitamin C   prenatal multivitamin Tabs tablet Take 1 tablet by mouth daily at 12 noon.       Follow-up Information    [facility]. Schedule an appointment as soon as possible for a visit in 3 day(s).   Contact information: [address] [place] [address] [phone]              Signed:  Quillian Quince 02/19/2020 1:33 PM

## 2020-02-22 ENCOUNTER — Other Ambulatory Visit: Payer: Self-pay

## 2020-02-22 ENCOUNTER — Observation Stay
Admission: EM | Admit: 2020-02-22 | Discharge: 2020-02-23 | Disposition: A | Discharge status: Home / Self Care | Payer: Medicaid Other | Attending: Obstetrics and Gynecology | Admitting: Obstetrics and Gynecology

## 2020-02-22 ENCOUNTER — Encounter: Payer: Self-pay | Admitting: Obstetrics and Gynecology

## 2020-02-22 DIAGNOSIS — O4703 False labor before 37 completed weeks of gestation, third trimester: Secondary | ICD-10-CM | POA: Diagnosis present

## 2020-02-22 DIAGNOSIS — O99013 Anemia complicating pregnancy, third trimester: Secondary | ICD-10-CM | POA: Diagnosis not present

## 2020-02-22 DIAGNOSIS — D649 Anemia, unspecified: Secondary | ICD-10-CM | POA: Insufficient documentation

## 2020-02-22 DIAGNOSIS — Z3A33 33 weeks gestation of pregnancy: Secondary | ICD-10-CM | POA: Diagnosis not present

## 2020-02-22 DIAGNOSIS — Z20822 Contact with and (suspected) exposure to covid-19: Secondary | ICD-10-CM | POA: Insufficient documentation

## 2020-02-22 DIAGNOSIS — O4700 False labor before 37 completed weeks of gestation, unspecified trimester: Secondary | ICD-10-CM | POA: Diagnosis present

## 2020-02-22 DIAGNOSIS — O26843 Uterine size-date discrepancy, third trimester: Secondary | ICD-10-CM | POA: Insufficient documentation

## 2020-02-22 DIAGNOSIS — O365990 Maternal care for other known or suspected poor fetal growth, unspecified trimester, not applicable or unspecified: Secondary | ICD-10-CM | POA: Diagnosis present

## 2020-02-22 LAB — CHLAMYDIA/NGC RT PCR (ARMC ONLY)
Chlamydia Tr: NOT DETECTED
N gonorrhoeae: NOT DETECTED

## 2020-02-22 LAB — URINALYSIS, ROUTINE W REFLEX MICROSCOPIC
Bilirubin Urine: NEGATIVE
Glucose, UA: NEGATIVE mg/dL
Hgb urine dipstick: NEGATIVE
Ketones, ur: NEGATIVE mg/dL
Nitrite: NEGATIVE
Protein, ur: NEGATIVE mg/dL
Specific Gravity, Urine: 1.023 (ref 1.005–1.030)
pH: 6 (ref 5.0–8.0)

## 2020-02-22 LAB — GROUP B STREP BY PCR: Group B strep by PCR: NEGATIVE

## 2020-02-22 LAB — TYPE AND SCREEN
ABO/RH(D): A NEG
Antibody Screen: POSITIVE

## 2020-02-22 LAB — CBC
HCT: 33.4 % — ABNORMAL LOW (ref 36.0–46.0)
Hemoglobin: 10.6 g/dL — ABNORMAL LOW (ref 12.0–15.0)
MCH: 24.5 pg — ABNORMAL LOW (ref 26.0–34.0)
MCHC: 31.7 g/dL (ref 30.0–36.0)
MCV: 77.1 fL — ABNORMAL LOW (ref 80.0–100.0)
Platelets: 176 10*3/uL (ref 150–400)
RBC: 4.33 MIL/uL (ref 3.87–5.11)
RDW: 13.3 % (ref 11.5–15.5)
WBC: 9.9 10*3/uL (ref 4.0–10.5)
nRBC: 0 % (ref 0.0–0.2)

## 2020-02-22 LAB — RESP PANEL BY RT-PCR (FLU A&B, COVID) ARPGX2
Influenza A by PCR: NEGATIVE
Influenza B by PCR: NEGATIVE
SARS Coronavirus 2 by RT PCR: NEGATIVE

## 2020-02-22 LAB — OB RESULTS CONSOLE GC/CHLAMYDIA
Chlamydia: NEGATIVE
Gonorrhea: NEGATIVE

## 2020-02-22 MED ORDER — LACTATED RINGERS IV SOLN: 500.0000 mL | INTRAVENOUS | Status: DC | PRN

## 2020-02-22 MED ORDER — OXYTOCIN-SODIUM CHLORIDE 30-0.9 UT/500ML-% IV SOLN: 2.5000 [IU]/h | INTRAVENOUS | Status: DC

## 2020-02-22 MED ORDER — ACETAMINOPHEN 325 MG PO TABS: 650.0000 mg | ORAL_TABLET | ORAL | Status: DC | PRN

## 2020-02-22 MED ORDER — LACTATED RINGERS IV SOLN: INTRAVENOUS | Status: AC

## 2020-02-22 MED ORDER — AMPICILLIN SODIUM 2 G IJ SOLR
2.0000 g | Freq: Once | INTRAMUSCULAR | Status: DC
  Filled 2020-02-22: qty 2000

## 2020-02-22 MED ORDER — BUTORPHANOL TARTRATE 1 MG/ML IJ SOLN
1.0000 mg | INTRAMUSCULAR | Status: DC | PRN
  Administered 2020-02-22: 1 mg via INTRAVENOUS
  Filled 2020-02-22: qty 1

## 2020-02-22 MED ORDER — TERBUTALINE SULFATE 1 MG/ML IJ SOLN
0.2500 mg | Freq: Once | INTRAMUSCULAR | Status: AC
  Administered 2020-02-22: 0.25 mg via SUBCUTANEOUS

## 2020-02-22 MED ORDER — LIDOCAINE HCL (PF) 1 % IJ SOLN: 30.0000 mL | INTRAMUSCULAR | Status: DC | PRN

## 2020-02-22 MED ORDER — AMPICILLIN SODIUM 1 G IJ SOLR: 1.0000 g | INTRAMUSCULAR | Status: DC

## 2020-02-22 MED ORDER — LACTATED RINGERS IV BOLUS
500.0000 mL | Freq: Once | INTRAVENOUS | Status: AC
  Administered 2020-02-22: 500 mL via INTRAVENOUS

## 2020-02-22 MED ORDER — BETAMETHASONE SOD PHOS & ACET 6 (3-3) MG/ML IJ SUSP
12.0000 mg | INTRAMUSCULAR | Status: AC
  Administered 2020-02-22 – 2020-02-23 (×2): 12 mg via INTRAMUSCULAR
  Filled 2020-02-22 (×2): qty 5

## 2020-02-22 MED ORDER — OXYTOCIN BOLUS FROM INFUSION: 333.0000 mL | Freq: Once | INTRAVENOUS | Status: DC

## 2020-02-22 MED ORDER — CALCIUM CARBONATE ANTACID 500 MG PO CHEW: 2.0000 | CHEWABLE_TABLET | ORAL | Status: DC | PRN

## 2020-02-22 MED ORDER — LACTATED RINGERS IV SOLN: INTRAVENOUS | Status: DC

## 2020-02-22 MED ORDER — HYDROXYZINE HCL 25 MG PO TABS
25.0000 mg | ORAL_TABLET | Freq: Once | ORAL | Status: AC | PRN
  Administered 2020-02-22: 25 mg via ORAL
  Filled 2020-02-22: qty 1

## 2020-02-22 MED ORDER — ACETAMINOPHEN 500 MG PO TABS
1000.0000 mg | ORAL_TABLET | Freq: Four times a day (QID) | ORAL | Status: DC | PRN
  Administered 2020-02-23: 1000 mg via ORAL
  Filled 2020-02-22: qty 2

## 2020-02-22 MED ORDER — ONDANSETRON HCL 4 MG/2ML IJ SOLN: 4.0000 mg | Freq: Four times a day (QID) | INTRAMUSCULAR | Status: DC | PRN

## 2020-02-22 MED ORDER — TERBUTALINE SULFATE 1 MG/ML IJ SOLN
INTRAMUSCULAR | Status: AC
  Filled 2020-02-22: qty 1

## 2020-02-22 NOTE — Progress Notes (Signed)
Pt's case presented to me by [facility]. Goal is to suppress ctx and buy time to het 48 hrs of Betamethasone into mother's system for FLM . If ctx persist on terbutaline may have to revert to Magnesium .

## 2020-02-22 NOTE — H&P (Signed)
OB History & Physical   History of Present Illness:   Chief Complaint: contractions   HPI:  Angela Levy is a 25 y.o. G60P2002 female at [redacted]w[redacted]d dated by LMP of 07/05/2019, c/w [place] at [redacted]w[redacted]d.  She presents to L&D for progressively worse contractions.  Reports active fetal movement  Contractions: every 3 to 5 minutes, starting at 0230 today  LOF/SROM: denies  Vaginal bleeding: denies   Pregnancy Issues: 1. Uterine S<D - 01/29/20 EFW 1541g (50%) 2. Anemia in pregnancy  3. History of IUGR 4. History of POTS  5. Chronic back/sciatic pain   Patient Active Problem List   Diagnosis Date Noted  . Threatened preterm labor, third trimester 02/22/2020  . Braxton Hick's sign 02/19/2020  . Preterm contractions 01/17/2020  . Labor and delivery, indication for care 01/03/2018  . Labor and delivery indication for care or intervention 01/03/2018  . Dysuria during pregnancy 10/17/2017  . IUGR (intrauterine growth restriction) affecting care of mother 06/13/2016  . Hx of esophagitis 06/08/2014  . Hx of tinea corporis 06/08/2014     Maternal Medical History:   Past Medical History:  Diagnosis Date  . Anxiety   . Migraines   . Sciatica of right side   . Scoliosis     Past Surgical History:  Procedure Laterality Date  . NO PAST SURGERIES      No Known Allergies  Prior to Admission medications   Medication Sig Start Date End Date Taking? Authorizing Provider  Prenatal Vit-Fe Fumarate-FA (PRENATAL MULTIVITAMIN) TABS tablet Take 1 tablet by mouth daily at 12 noon. 06/16/16  Yes Schermerhorn, Ihor Austin, MD  ferrous sulfate 325 (65 FE) MG tablet Take 1 tablet (325 mg total) by mouth daily with breakfast. Take with Vitamin C 01/05/18 03/06/18  Christeen Douglas, MD   Prenatal care site:  [facility]  Social History: She  reports that she has never smoked. She has never used smokeless tobacco. She reports that she does not drink alcohol and does not use drugs.  Family History:  family history is not on file.   Review of Systems: A full review of systems was performed and negative except as noted in the HPI.     Physical Exam:  Vital Signs: BP 111/63 (BP Location: Left Arm)   Pulse (!) 101   Temp 98.2 F (36.8 C) (Oral)   Resp 18   Ht 5\' 7"  (1.702 m)   Wt 73 kg   LMP 07/05/2019   BMI 25.22 kg/m  Physical Exam  General: no acute distress.  HEENT: normocephalic, atraumatic Heart: regular rate & rhythm.  No murmurs/rubs/gallops Lungs: clear to auscultation bilaterally, normal respiratory effort Abdomen: soft, gravid, non-tender;  EFW: 4lbs  Pelvic:   External: Normal external female genitalia  Cervix: Dilation: 2 / Effacement (%): 60 / Station: -2   Previous exam 1/50/-1 Extremities: non-tender, symmetric, No edema bilaterally.  DTRs: 2+/2+  Neurologic: Alert & oriented x 3.    Results for orders placed or performed during the hospital encounter of 02/22/20 (from the past 24 hour(s))  Urinalysis, Routine w reflex microscopic Urine, Clean Catch     Status: Abnormal   Collection Time: 02/22/20 12:51 PM  Result Value Ref Range   Color, Urine YELLOW (A) YELLOW   APPearance HAZY (A) CLEAR   Specific Gravity, Urine 1.023 1.005 - 1.030   pH 6.0 5.0 - 8.0   Glucose, UA NEGATIVE NEGATIVE mg/dL   Hgb urine dipstick NEGATIVE NEGATIVE   Bilirubin Urine NEGATIVE  NEGATIVE   Ketones, ur NEGATIVE NEGATIVE mg/dL   Protein, ur NEGATIVE NEGATIVE mg/dL   Nitrite NEGATIVE NEGATIVE   Leukocytes,Ua MODERATE (A) NEGATIVE   RBC / HPF 0-5 0 - 5 RBC/hpf   WBC, UA 21-50 0 - 5 WBC/hpf   Bacteria, UA RARE (A) NONE SEEN   Squamous Epithelial / LPF 0-5 0 - 5   Mucus PRESENT     Pertinent Results:  Prenatal Labs: Blood type/Rh A neg  Antibody screen neg  Rubella Immune  Varicella Immune  RPR NR  HBsAg Neg  HIV NR  GC neg  Chlamydia neg  Genetic screening negative  1 hour GTT 76  3 hour GTT NA  GBS GBS neg on 02/22/2019   FHT: Baseline: 120 bpm, Variability:  moderate, Accelerations: present and Decelerations: Absent TOCO: regular, every 2-4 minutes, mild to palpation  SVE:  Dilation: 2 / Effacement (%): 60 / Station: -2    Cephalic by Leopolds and SVE   No results found.  Assessment:  Angela Levy is a 25 y.o. G44P2002 female at [redacted]w[redacted]d with threatened preterm labor.   Plan:  1. Admit to Labor & Delivery; consents reviewed and obtained - Covid admission screen negative - Dr. Ouida Sills notified of admission and reviewed plan of care   2. Fetal Well being  - Fetal Tracing: Cat 1 - Group B Streptococcus ppx indicated: GBS unknown - GBS PCR collected  - Will start Ampicillin for GBS prophylaxis until results are known  - Presentation: cephalic  confirmed by SVE    3. Routine OB: - Prenatal labs reviewed, as above - Rh neg  - CBC, T&S, RPR on admit - Reg diet, IVF  4. Threatened preterm labor  - Contractions monitored with external toco - Plan for  continuous fetal monitoring  - Betamethasone x 2  - IV pain medicine for pain controlled  - NICU consult placed  - Terbutaline ordered for tocolysis  - Discussed Dr. Ouida Sills, may consider magnesium sulfate if terbutaline not effective to allow time for steroid administration   Minda Meo, CNM 02/22/20 2:31 PM  Drinda Butts, CNM Certified Nurse Midwife [facility]

## 2020-02-22 NOTE — Progress Notes (Signed)
Patient stated that IV in left anterior wrist was "hurting and uncomfortable". I assessed the IV site and there was no swelling, redness, or noticeable issue with the IV site. IV infusing with no issues. Patient requested that the nurse take the IV out and start a new IV elsewhere. IV on left wrist was removed per patient, IV intact. New IV started on right forearm.

## 2020-02-22 NOTE — OB Triage Note (Signed)
Patient is G3P2, [redacted]w[redacted]d that presents with contractions, states that the contractions are uncomfortable but not painful. Patient rates pain 0 on a 0-10 pain scale. Patient states that she feels pressure in her vagina and buttock area with ctx. Patient states that she has been having Deberah Pelton for 3 weeks and the stronger contractions started at 0230am today and getting progressively uncomfortable. No relief with PO tylenol. Patient states that she has had a clear, mucousy discharge the past couple of days. Denies LOF and Bleeding. +FM. VSS. Monitors applied and assessing.

## 2020-02-22 NOTE — Progress Notes (Signed)
Labor Progress Note  Angela Levy is a 25 y.o. G3P2002 at [redacted]w[redacted]d by LMP admitted for threatened preterm labor   Subjective: contractions initially improved after terbutaline but felt more painful a couple hour later.  Stadol helped with pain but made her feel "drunk." Reports that contractions are starting to feel more like cramping.   Objective: BP 110/66 (BP Location: Left Arm)   Pulse 99   Temp 97.8 F (36.6 C) (Oral)   Resp 16   Ht 5\' 7"  (1.702 m)   Wt 73 kg   LMP 07/05/2019   BMI 25.22 kg/m  Notable VS details: reviewed   Fetal Assessment: FHT:  FHR: 120 bpm, variability: moderate,  accelerations:  Present,  decelerations:  Absent Category/reactivity:  Category I UC:   irregular, every 2-7 minutes SVE: 2-3/70/-1/post Membrane status: Intact Amniotic color: N/A   Labs: Lab Results  Component Value Date   WBC 9.9 02/22/2020   HGB 10.6 (L) 02/22/2020   HCT 33.4 (L) 02/22/2020   MCV 77.1 (L) 02/22/2020   PLT 176 02/22/2020    Assessment / Plan: Threatened PTL, minimal change since exam 7 hours ago -Reassuring fetal tracing, Cat 1 -Continue with IV fluids  -Stadol discontinued d/t patient intolerance  -Vistaril ordered for sleep  -NICU consult completed  -Will continue to monitor closely for progressive preterm labor  -2nd betamethasone due tomorrow at 1500   04/21/2020, CNM 02/22/2020, 8:58 PM

## 2020-02-23 LAB — RPR: RPR Ser Ql: NONREACTIVE

## 2020-02-23 MED ORDER — NIFEDIPINE 10 MG PO CAPS: 10.0000 mg | ORAL_CAPSULE | Freq: Four times a day (QID) | ORAL | 0 refills | Status: DC

## 2020-02-23 MED ORDER — NIFEDIPINE 10 MG PO CAPS
10.0000 mg | ORAL_CAPSULE | Freq: Four times a day (QID) | ORAL | Status: DC
  Administered 2020-02-23 (×2): 10 mg via ORAL
  Filled 2020-02-23 (×2): qty 1

## 2020-02-23 MED ORDER — CALCIUM CARBONATE ANTACID 500 MG PO CHEW: 2.0000 | CHEWABLE_TABLET | ORAL | Status: DC | PRN

## 2020-02-23 MED ORDER — ACETAMINOPHEN 500 MG PO TABS: 1000.0000 mg | ORAL_TABLET | Freq: Four times a day (QID) | ORAL | 0 refills | Status: DC | PRN

## 2020-02-23 NOTE — Discharge Summary (Signed)
Patient ID: Angela Levy MRN: [id number] DOB/AGE: 05/19/1995 25 y.o.  Admit date: 02/22/2020 Discharge date: 02/23/2020  Admission Diagnoses: 33wks preterm labor   Discharge Diagnoses: 33 weeks preterm labor  Prenatal Procedures: NST  Consults: Neonatology, Maternal Fetal Medicine  Significant Diagnostic Studies:  Results for orders placed or performed during the hospital encounter of 02/22/20 (from the past 168 hour(s))  Urinalysis, Routine w reflex microscopic Urine, Clean Catch   Collection Time: 02/22/20 12:51 PM  Result Value Ref Range   Color, Urine YELLOW (A) YELLOW   APPearance HAZY (A) CLEAR   Specific Gravity, Urine 1.023 1.005 - 1.030   pH 6.0 5.0 - 8.0   Glucose, UA NEGATIVE NEGATIVE mg/dL   Hgb urine dipstick NEGATIVE NEGATIVE   Bilirubin Urine NEGATIVE NEGATIVE   Ketones, ur NEGATIVE NEGATIVE mg/dL   Protein, ur NEGATIVE NEGATIVE mg/dL   Nitrite NEGATIVE NEGATIVE   Leukocytes,Ua MODERATE (A) NEGATIVE   RBC / HPF 0-5 0 - 5 RBC/hpf   WBC, UA 21-50 0 - 5 WBC/hpf   Bacteria, UA RARE (A) NONE SEEN   Squamous Epithelial / LPF 0-5 0 - 5   Mucus PRESENT   CBC   Collection Time: 02/22/20  2:41 PM  Result Value Ref Range   WBC 9.9 4.0 - 10.5 K/uL   RBC 4.33 3.87 - 5.11 MIL/uL   Hemoglobin 10.6 (L) 12.0 - 15.0 g/dL   HCT 33.4 (L) 36.0 - 46.0 %   MCV 77.1 (L) 80.0 - 100.0 fL   MCH 24.5 (L) 26.0 - 34.0 pg   MCHC 31.7 30.0 - 36.0 g/dL   RDW 13.3 11.5 - 15.5 %   Platelets 176 150 - 400 K/uL   nRBC 0.0 0.0 - 0.2 %  RPR   Collection Time: 02/22/20  2:41 PM  Result Value Ref Range   RPR Ser Ql NON REACTIVE NON REACTIVE  Type and screen Angela Levy   Collection Time: 02/22/20  2:41 PM  Result Value Ref Range   ABO/RH(D) A NEG    Antibody Screen POS    Sample Expiration 02/25/2020,2359    Antibody Identification      PASSIVELY ACQUIRED ANTI-D Performed at [facility], [address] [place], [place] [address]   Group B strep by  PCR   Collection Time: 02/22/20  2:54 PM   Specimen: Vaginal/Rectal; Genital  Result Value Ref Range   Group B strep by PCR NEGATIVE NEGATIVE  Chlamydia/NGC rt PCR ([place] only)   Collection Time: 02/22/20  2:54 PM   Specimen: Vaginal/Rectal; GU  Result Value Ref Range   Specimen source GC/Chlam URINE, RANDOM    Chlamydia Tr NOT DETECTED NOT DETECTED   N gonorrhoeae NOT DETECTED NOT DETECTED  Resp Panel by RT-PCR (Flu A&B, Covid) Nasopharyngeal Swab   Collection Time: 02/22/20  2:54 PM   Specimen: Nasopharyngeal Swab; Nasopharyngeal(NP) swabs in vial transport medium  Result Value Ref Range   SARS Coronavirus 2 by RT PCR NEGATIVE NEGATIVE   Influenza A by PCR NEGATIVE NEGATIVE   Influenza B by PCR NEGATIVE NEGATIVE  Results for orders placed or performed during the hospital encounter of 02/19/20 (from the past 168 hour(s))  Wet prep, genital   Collection Time: 02/19/20  1:03 PM   Specimen: Vaginal  Result Value Ref Range   Yeast Wet Prep HPF POC NONE SEEN NONE SEEN   Trich, Wet Prep NONE SEEN NONE SEEN   Clue Cells Wet Prep HPF POC NONE SEEN NONE  SEEN   WBC, Wet Prep HPF POC FEW (A) NONE SEEN   Sperm NONE SEEN     Treatments: IV hydration, steroids: betamethasone x 2 doses, and tocolysis- Terb and procardia.   Hospital Course:  This is a 25 y.o. [id number] with IUP at 7316w4d admitted for preterm contractions, noted to have a cervical exam of 1cm initially, with cervical change to 3.5cm.  No leaking of fluid and no bleeding.  She was initially given terbutaline, PO and IV hydration for tocolysis and also received betamethasone x 2 doses.  Her tocolysis was transitioned to Procardia this morning on 02/23/20.  She was observed, fetal heart rate monitoring remained reassuring, and she had no signs/symptoms of progressing preterm labor or other maternal-fetal concerns.  Her cervical exam was unchanged from this morning's exam. She was deemed stable for discharge to home with outpatient follow  up.   Discharge Physical Exam:  BP 106/62   Pulse (!) 114   Temp 97.8 F (36.6 C) (Oral)   Resp 16   Ht 5\' 7"  (1.702 m)   Wt 73 kg   LMP 07/05/2019   BMI 25.22 kg/m   General: NAD CV: RRR Pulm: CTABL, nl effort ABD: s/nd/nt, gravid, fundus soft/nontender.  DVT Evaluation: LE non-ttp, no evidence of DVT on exam.  SVE: 3-4/60/-1, soft/posterior FHT: 135bpm, moderate variability, + accels, no decels TOCO: Irreg UCs with UI noted.    Discharge Condition: Stable  Disposition: Discharge disposition: 01-Home or Self Care       Discharge Instructions    Discharge activity:  Bathroom / Shower only   Complete by: As directed    Discharge activity:  Up to eat   Complete by: As directed    Discharge diet:  No restrictions   Complete by: As directed    Do not have sex or do anything that might make you have an orgasm   Complete by: As directed    Notify physician for a general feeling that "something is not right"   Complete by: As directed    Notify physician for increase or change in vaginal discharge   Complete by: As directed    Notify physician for intestinal cramps, with or without diarrhea, sometimes described as "gas pain"   Complete by: As directed    Notify physician for leaking of fluid   Complete by: As directed    Notify physician for low, dull backache, unrelieved by heat or Tylenol   Complete by: As directed    Notify physician for menstrual like cramps   Complete by: As directed    Notify physician for pelvic pressure   Complete by: As directed    Notify physician for uterine contractions.  These may be painless and feel like the uterus is tightening or the baby is  "balling up"   Complete by: As directed    Notify physician for vaginal bleeding   Complete by: As directed    PRETERM LABOR:  Includes any of the follwing symptoms that occur between 20 - [redacted] weeks gestation.  If these symptoms are not stopped, preterm labor can result in preterm delivery,  placing your baby at risk   Complete by: As directed      Allergies as of 02/23/2020   No Known Allergies     Medication List    TAKE these medications   acetaminophen 500 MG tablet Commonly known as: TYLENOL Take 2 tablets (1,000 mg total) by mouth every 6 (six) hours as needed (for  pain scale < 4ORtemperature>/=100.5 F).   calcium carbonate 500 MG chewable tablet Commonly known as: TUMS - dosed in mg elemental calcium Chew 2 tablets (400 mg of elemental calcium total) by mouth every 4 (four) hours as needed for indigestion.   ferrous sulfate 325 (65 FE) MG tablet Take 1 tablet (325 mg total) by mouth daily with breakfast. Take with Vitamin C   NIFEdipine 10 MG capsule Commonly known as: PROCARDIA Take 1 capsule (10 mg total) by mouth every 6 (six) hours. Start taking on: February 24, 2020   prenatal multivitamin Tabs tablet Take 1 tablet by mouth daily at 12 noon.       Follow-up Information    Minda Meo, CNM Follow up.   Specialty: Certified Nurse Midwife Why: see Vicente Males or Wells Guiles early next week.  Contact information: [place] [address] [phone]               Signed:  Francetta Found, CNM 02/23/2020  5:46 PM

## 2020-02-23 NOTE — OB Triage Note (Signed)
Discharge orders received. RN educated patient on new medications and labor precautions. Pt verbalized understanding of teaching and has no further questions at this time. Pt discharged home in stable condition with friend.

## 2020-02-24 ENCOUNTER — Observation Stay
Admission: EM | Admit: 2020-02-24 | Discharge: 2020-02-24 | Disposition: A | Discharge status: Home / Self Care | Payer: Medicaid Other | Attending: Obstetrics and Gynecology | Admitting: Obstetrics and Gynecology

## 2020-02-24 ENCOUNTER — Other Ambulatory Visit: Payer: Self-pay

## 2020-02-24 DIAGNOSIS — O0993 Supervision of high risk pregnancy, unspecified, third trimester: Principal | ICD-10-CM | POA: Insufficient documentation

## 2020-02-24 DIAGNOSIS — Z3A33 33 weeks gestation of pregnancy: Secondary | ICD-10-CM | POA: Diagnosis not present

## 2020-02-24 DIAGNOSIS — O4700 False labor before 37 completed weeks of gestation, unspecified trimester: Secondary | ICD-10-CM | POA: Diagnosis present

## 2020-02-24 MED ORDER — ACETAMINOPHEN 325 MG PO TABS: 650.0000 mg | ORAL_TABLET | ORAL | Status: DC | PRN

## 2020-02-24 NOTE — OB Triage Note (Addendum)
Patient to OBS 2 with complaint of contractions. Cervix checked by provider, no change.  NST. Pt discharged home.

## 2020-02-24 NOTE — Discharge Summary (Signed)
Angela Levy is a 25 y.o. female. She is at [redacted]w[redacted]d gestation. Patient's last menstrual period was 07/05/2019. Estimated Date of Delivery: 04/08/20  Prenatal care site: [facility]  Current pregnancy complicated by:  1. Admission for preterm contractions with cervical change 1/6-1/7; given betamethasone, IV Hydration and DC home on Procardia.  2. Uterine S<D - 01/29/20 EFW 1541g (50%) 3. Anemia in pregnancy  4. History of IUGR 5. History of POTS  6. Chronic back/sciatic pain    Chief complaint: contractions slightly more frequent this afternoon, took her dose of procardia at 1600 as scheduled, increased mucus discharge noted as well.    S: Resting comfortably. no CTX, no VB.no LOF,  Active fetal movement.Denies: HA, visual changes, SOB, or RUQ/epigastric pain  Maternal Medical History:   Past Medical History:  Diagnosis Date  . Anxiety   . Migraines   . Sciatica of right side   . Scoliosis     Past Surgical History:  Procedure Laterality Date  . NO PAST SURGERIES      No Known Allergies  Prior to Admission medications   Medication Sig Start Date End Date Taking? Authorizing Provider  acetaminophen (TYLENOL) 500 MG tablet Take 2 tablets (1,000 mg total) by mouth every 6 (six) hours as needed (for pain scale < 4ORtemperature>/=100.5 F). 02/23/20  Yes Baylee Mccorkel, Murray Hodgkins, CNM  NIFEdipine (PROCARDIA) 10 MG capsule Take 1 capsule (10 mg total) by mouth every 6 (six) hours. 02/24/20  Yes [name], Murray Hodgkins, CNM  Prenatal Vit-Fe Fumarate-FA (PRENATAL MULTIVITAMIN) TABS tablet Take 1 tablet by mouth daily at 12 noon. 06/16/16  Yes Schermerhorn, [name], MD  calcium carbonate (TUMS - DOSED IN MG ELEMENTAL CALCIUM) 500 MG chewable tablet Chew 2 tablets (400 mg of elemental calcium total) by mouth every 4 (four) hours as needed for indigestion. Patient not taking: Reported on 02/24/2020 02/23/20   [name], Murray Hodgkins, CNM  ferrous sulfate 325 (65 FE) MG tablet Take 1 tablet (325 mg  total) by mouth daily with breakfast. Take with Vitamin C 01/05/18 03/06/18  [name], MD      Social History: She  reports that she has never smoked. She has never used smokeless tobacco. She reports that she does not drink alcohol and does not use drugs.  Family History:  no history of gyn cancers  Review of Systems: A full review of systems was performed and negative except as noted in the HPI.     O:  BP 109/63 (BP Location: Left Arm)   Pulse 96   Temp 97.7 F (36.5 C) (Oral)   Resp 16   Ht 5\' 7"  (1.702 m)   Wt 72.6 kg Comment: 160lbs  LMP 07/05/2019   BMI 25.06 kg/m  No results found for this or any previous visit (from the past 48 hour(s)).   Constitutional: NAD, AAOx3  HE/ENT: extraocular movements grossly intact, moist mucous membranes CV: RRR PULM: nl respiratory effort, CTABL     Abd: gravid, non-tender, non-distended, soft      Ext: Non-tender, Nonedematous   Psych: mood appropriate, speech normal Pelvic: cervix more posterior than previous exam by same provider. 3/60/-1, soft. No bleeding, no LOF, no Pooling on SSE. Small amt light brown mucus noted.   Fetal  monitoring: Cat I Appropriate for GA, Reactive Baseline: 130bpm Variability: moderate Accelerations: present x >2 Decelerations absent  TOCO: occasional UCs.     A/P: 26 y.o. [redacted]w[redacted]d here for antenatal surveillance for preterm contractions  Principle Diagnosis:  High risk pregnancy in third trimester   Preterm labor: not present. No cervical change since DC yesterday.   Fetal Wellbeing: Reassuring Cat 1 tracing with Reactive NST   D/c home stable, precautions reviewed, follow-up as scheduled.    Francetta Found, CNM 02/24/2020  5:41 PM

## 2020-02-24 NOTE — Discharge Instructions (Signed)
Please keep your next scheduled appointment.  If you have questions or concerns please call the on call provider.  If you have urgent concerns please go to the nearest emergency department for evaluation.  If you have a nurse question you may call the [facility] nurse's desk [phone].

## 2020-03-07 ENCOUNTER — Other Ambulatory Visit: Payer: Self-pay

## 2020-03-07 ENCOUNTER — Observation Stay
Admission: EM | Admit: 2020-03-07 | Discharge: 2020-03-07 | Disposition: A | Discharge status: Home / Self Care | Payer: Medicaid Other | Attending: Obstetrics and Gynecology | Admitting: Obstetrics and Gynecology

## 2020-03-07 ENCOUNTER — Encounter: Payer: Self-pay | Admitting: Obstetrics and Gynecology

## 2020-03-07 DIAGNOSIS — Z3A35 35 weeks gestation of pregnancy: Secondary | ICD-10-CM | POA: Diagnosis not present

## 2020-03-07 DIAGNOSIS — O4703 False labor before 37 completed weeks of gestation, third trimester: Secondary | ICD-10-CM | POA: Diagnosis present

## 2020-03-07 LAB — WET PREP, GENITAL
Clue Cells Wet Prep HPF POC: NONE SEEN
Sperm: NONE SEEN
Trich, Wet Prep: NONE SEEN
Yeast Wet Prep HPF POC: NONE SEEN

## 2020-03-07 LAB — RUPTURE OF MEMBRANE (ROM)PLUS: Rom Plus: NEGATIVE

## 2020-03-07 NOTE — OB Triage Note (Signed)
Pt reports leaking of fluid. Denies vaginal bleeding. Reports + fetal movement. Angela Levy

## 2020-03-07 NOTE — Discharge Summary (Signed)
Angela Levy is a 25 y.o. female. She is at [redacted]w[redacted]d gestation. Patient's last menstrual period was 07/05/2019. Estimated Date of Delivery: 04/08/20  Prenatal care site: [facility]   Current pregnancy complicated by:  1. Uterine S<D - 01/29/20 EFW 1541g (50%) 2. Anemia in pregnancy  3. History of IUGR 4. History of POTS  5. Chronic back/sciatic pain  6. Preterm contractions with dilation to 3.5cm  Chief complaint: Leaking fluid, noticed increased discharge when up to bathroom this afternoon. More UCs since being checked in office yesterday.    S: Resting comfortably. rare CTX, no VB.no LOF,  Active fetal movement. Denies: HA, visual changes, SOB, or RUQ/epigastric pain  Maternal Medical History:   Past Medical History:  Diagnosis Date  . Anxiety   . Migraines   . Sciatica of right side   . Scoliosis     Past Surgical History:  Procedure Laterality Date  . NO PAST SURGERIES      No Known Allergies  Prior to Admission medications   Medication Sig Start Date End Date Taking? Authorizing Provider  acetaminophen (TYLENOL) 500 MG tablet Take 2 tablets (1,000 mg total) by mouth every 6 (six) hours as needed (for pain scale < 4ORtemperature>/=100.5 F). 02/23/20  Yes Lundy Cozart, Murray Hodgkins, CNM  calcium carbonate (TUMS - DOSED IN MG ELEMENTAL CALCIUM) 500 MG chewable tablet Chew 2 tablets (400 mg of elemental calcium total) by mouth every 4 (four) hours as needed for indigestion. 02/23/20  Yes Lucine Bilski, Murray Hodgkins, CNM  Prenatal Vit-Fe Fumarate-FA (PRENATAL MULTIVITAMIN) TABS tablet Take 1 tablet by mouth daily at 12 noon. 06/16/16  Yes Schermerhorn, [name], MD  ferrous sulfate 325 (65 FE) MG tablet Take 1 tablet (325 mg total) by mouth daily with breakfast. Take with Vitamin C 01/05/18 03/06/18  [name], MD  NIFEdipine (PROCARDIA) 10 MG capsule Take 1 capsule (10 mg total) by mouth every 6 (six) hours. Patient not taking: Reported on 03/07/2020 02/24/20   Corvin Sorbo, Murray Hodgkins,  CNM      Social History: She  reports that she has never smoked. She has never used smokeless tobacco. She reports that she does not drink alcohol and does not use drugs.  Family History:  no history of gyn cancers  Review of Systems: A full review of systems was performed and negative except as noted in the HPI.     O:  BP 109/70 (BP Location: Left Arm)   Pulse (!) 107   Temp 98 F (36.7 C) (Oral)   Resp 16   Ht 5\' 7"  (1.702 m)   Wt 75.3 kg   LMP 07/05/2019   BMI 26.00 kg/m  Results for orders placed or performed during the hospital encounter of 03/07/20 (from the past 48 hour(s))  ROM Plus ([place] only)   Collection Time: 03/07/20  3:22 PM  Result Value Ref Range   Rom Plus NEGATIVE      Constitutional: NAD, AAOx3  HE/ENT: extraocular movements grossly intact, moist mucous membranes CV: RRR PULM: nl respiratory effort, CTABL     Abd: gravid, non-tender, non-distended, soft      Ext: Non-tender, Nonedematous   Psych: mood appropriate, speech normal Pelvic: SSE done- Neg pooling with valsalva, small amount yellow mucus. Wet prep sent.   Fetal  monitoring: Cat I Appropriate for GA Baseline: 140bpm Variability: moderate Accelerations: present x >2 Decelerations: 1 variable decel x 1, to 90bpm x 100sec Time 66mins    A/P: 25 y.o. [redacted]w[redacted]d here for antenatal  surveillance for preterm contractions and suspected ROM not found.    Labor: not present. No cervical change  Fetal Wellbeing: Reassuring Cat 1 tracing, Reactive NST after single isolated decel.    Neg ROM plus, SSE without pooling. Wet prep sent.   D/c home stable, precautions reviewed, follow-up as scheduled.    Francetta Found, CNM 03/07/2020  4:23 PM

## 2020-03-10 ENCOUNTER — Encounter: Payer: Self-pay | Admitting: Obstetrics and Gynecology

## 2020-03-10 ENCOUNTER — Observation Stay
Admission: EM | Admit: 2020-03-10 | Discharge: 2020-03-10 | Disposition: A | Discharge status: Home / Self Care | Payer: Medicaid Other

## 2020-03-10 ENCOUNTER — Observation Stay: Payer: Medicaid Other

## 2020-03-10 ENCOUNTER — Other Ambulatory Visit: Payer: Self-pay

## 2020-03-10 DIAGNOSIS — O99013 Anemia complicating pregnancy, third trimester: Secondary | ICD-10-CM | POA: Insufficient documentation

## 2020-03-10 DIAGNOSIS — O4700 False labor before 37 completed weeks of gestation, unspecified trimester: Secondary | ICD-10-CM | POA: Diagnosis present

## 2020-03-10 DIAGNOSIS — M5430 Sciatica, unspecified side: Secondary | ICD-10-CM | POA: Insufficient documentation

## 2020-03-10 DIAGNOSIS — Z3A35 35 weeks gestation of pregnancy: Secondary | ICD-10-CM | POA: Diagnosis not present

## 2020-03-10 DIAGNOSIS — Z9189 Other specified personal risk factors, not elsewhere classified: Secondary | ICD-10-CM

## 2020-03-10 MED ORDER — HYDROXYZINE PAMOATE 50 MG PO CAPS: 50.0000 mg | ORAL_CAPSULE | Freq: Every day | ORAL | 0 refills | Status: DC | PRN

## 2020-03-10 MED ORDER — CALCIUM CARBONATE ANTACID 500 MG PO CHEW: 2.0000 | CHEWABLE_TABLET | ORAL | Status: DC | PRN

## 2020-03-10 MED ORDER — ACETAMINOPHEN 325 MG PO TABS: 650.0000 mg | ORAL_TABLET | ORAL | Status: DC | PRN

## 2020-03-10 NOTE — Discharge Summary (Signed)
Angela Levy is a 25 y.o. female. She is at [redacted]w[redacted]d gestation. Patient's last menstrual period was 07/05/2019. Estimated Date of Delivery: 04/08/20  Prenatal care site: [facility]   Current pregnancy complicated by:  1. Uterine S<D - 01/29/20 EFW 1541g (50%) 2. Anemia in pregnancy  3. History of IUGR 4. History of POTS  5. Chronic back/sciatic pain  6. Preterm contractions with dilation to 3.5cm  Chief complaint: Worsening contractions, states they are more painful and more frequent.  Rating them 4/10.     S: Resting comfortably. rare CTX, no VB.no LOF,  Active fetal movement. Denies: HA, visual changes, SOB, or RUQ/epigastric pain  Maternal Medical History:   Past Medical History:  Diagnosis Date  . Anxiety   . Migraines   . Sciatica of right side   . Scoliosis     Past Surgical History:  Procedure Laterality Date  . NO PAST SURGERIES      No Known Allergies  Prior to Admission medications   Medication Sig Start Date End Date Taking? Authorizing Provider  acetaminophen (TYLENOL) 500 MG tablet Take 2 tablets (1,000 mg total) by mouth every 6 (six) hours as needed (for pain scale < 4ORtemperature>/=100.5 F). 02/23/20  Yes McVey, Murray Hodgkins, CNM  calcium carbonate (TUMS - DOSED IN MG ELEMENTAL CALCIUM) 500 MG chewable tablet Chew 2 tablets (400 mg of elemental calcium total) by mouth every 4 (four) hours as needed for indigestion. 02/23/20  Yes McVey, Murray Hodgkins, CNM  Prenatal Vit-Fe Fumarate-FA (PRENATAL MULTIVITAMIN) TABS tablet Take 1 tablet by mouth daily at 12 noon. 06/16/16  Yes Schermerhorn, [name], MD  ferrous sulfate 325 (65 FE) MG tablet Take 1 tablet (325 mg total) by mouth daily with breakfast. Take with Vitamin C 01/05/18 03/06/18  [name], MD  NIFEdipine (PROCARDIA) 10 MG capsule Take 1 capsule (10 mg total) by mouth every 6 (six) hours. Patient not taking: Reported on 03/07/2020 02/24/20   McVey, Murray Hodgkins, CNM      Social History: She   reports that she has never smoked. She has never used smokeless tobacco. She reports that she does not drink alcohol and does not use drugs.  Family History:  no history of gyn cancers  Review of Systems: A full review of systems was performed and negative except as noted in the HPI.     O:  BP 101/60 (BP Location: Right Arm)   Pulse (!) 109   Temp 97.8 F (36.6 C) (Axillary)   Resp 16   Ht 5\' 7"  (1.702 m)   Wt 75.3 kg Comment: 166lbs  LMP 07/05/2019   BMI 26.00 kg/m  No results found for this or any previous visit (from the past 48 hour(s)).   Constitutional: NAD, AAOx3  HE/ENT: extraocular movements grossly intact, moist mucous membranes CV: RRR PULM: nl respiratory effort, CTABL     Abd: gravid, non-tender, non-distended, soft      Ext: Non-tender, Nonedematous   Psych: mood appropriate, speech normal Pelvic: deferred    Fetal  Monitoring:  Baseline: 145bpm Variability: moderate Accelerations: present x >2 Decelerations: Occasional variable decel followed cat 1 tracing with accels  Cat: initially cat II with occasional variable, cat 1 prior to discharge    A/P: 25 y.o. [redacted]w[redacted]d here for antenatal surveillance for preterm contractions.    Labor: not present. No cervical change  Fetal Wellbeing: Reassuring Cat 1 tracing, Reactive NST after initial occasional variable     AFI normal at 12.8cm  Reassuring  BPP 8/8  Strict PTL precautions reviewed   D/c home stable, precautions reviewed, follow-up as scheduled this week.    Minda Meo, CNM 03/10/2020  3:14 PM  Drinda Butts, CNM Certified Nurse Midwife [facility]

## 2020-03-10 NOTE — Discharge Instructions (Signed)
Keep your next scheduled appointment.  If you have any questions or concerns you can call your on call provider.  If you have urgent concerns please go to the nearest emergency department for evaluation.

## 2020-03-14 ENCOUNTER — Other Ambulatory Visit: Payer: Self-pay

## 2020-03-14 DIAGNOSIS — D509 Iron deficiency anemia, unspecified: Secondary | ICD-10-CM

## 2020-03-14 DIAGNOSIS — O99019 Anemia complicating pregnancy, unspecified trimester: Secondary | ICD-10-CM

## 2020-03-18 ENCOUNTER — Other Ambulatory Visit: Payer: Self-pay

## 2020-03-18 ENCOUNTER — Ambulatory Visit
Admission: RE | Admit: 2020-03-18 | Discharge: 2020-03-18 | Disposition: A | Discharge status: Home / Self Care | Payer: Medicaid Other | Source: Ambulatory Visit

## 2020-03-18 DIAGNOSIS — D509 Iron deficiency anemia, unspecified: Secondary | ICD-10-CM | POA: Insufficient documentation

## 2020-03-18 DIAGNOSIS — Z3A00 Weeks of gestation of pregnancy not specified: Secondary | ICD-10-CM | POA: Insufficient documentation

## 2020-03-18 DIAGNOSIS — O99019 Anemia complicating pregnancy, unspecified trimester: Secondary | ICD-10-CM | POA: Diagnosis not present

## 2020-03-18 MED ORDER — LACTATED RINGERS IV SOLN: INTRAVENOUS | Status: DC

## 2020-03-18 MED ORDER — SODIUM CHLORIDE 0.9 % IV SOLN
200.0000 mg | INTRAVENOUS | Status: DC
  Administered 2020-03-18: 200 mg via INTRAVENOUS
  Filled 2020-03-18: qty 10

## 2020-03-18 NOTE — Progress Notes (Signed)
Spoke with Drinda Butts, CNM about patients elevated heart rate. Verbal order for LR 574ml bolus.

## 2020-03-21 ENCOUNTER — Encounter: Payer: Self-pay | Admitting: Obstetrics & Gynecology

## 2020-03-21 ENCOUNTER — Inpatient Hospital Stay: Payer: Medicaid Other | Admitting: Anesthesiology

## 2020-03-21 ENCOUNTER — Inpatient Hospital Stay
Admission: EM | Admit: 2020-03-21 | Discharge: 2020-03-23 | DRG: 807 | Disposition: A | Discharge status: Home / Self Care | Payer: Medicaid Other | Attending: Obstetrics and Gynecology | Admitting: Obstetrics and Gynecology

## 2020-03-21 ENCOUNTER — Other Ambulatory Visit: Payer: Self-pay

## 2020-03-21 DIAGNOSIS — Z3A37 37 weeks gestation of pregnancy: Secondary | ICD-10-CM

## 2020-03-21 DIAGNOSIS — D509 Iron deficiency anemia, unspecified: Secondary | ICD-10-CM | POA: Diagnosis present

## 2020-03-21 DIAGNOSIS — Z6791 Unspecified blood type, Rh negative: Secondary | ICD-10-CM

## 2020-03-21 DIAGNOSIS — O26893 Other specified pregnancy related conditions, third trimester: Secondary | ICD-10-CM | POA: Diagnosis present

## 2020-03-21 DIAGNOSIS — O9902 Anemia complicating childbirth: Secondary | ICD-10-CM | POA: Diagnosis present

## 2020-03-21 DIAGNOSIS — Z20822 Contact with and (suspected) exposure to covid-19: Secondary | ICD-10-CM | POA: Diagnosis present

## 2020-03-21 HISTORY — DX: Other specified cardiac arrhythmias: I49.8

## 2020-03-21 HISTORY — DX: Postural orthostatic tachycardia syndrome (POTS): G90.A

## 2020-03-21 LAB — TYPE AND SCREEN
ABO/RH(D): A NEG
Antibody Screen: POSITIVE

## 2020-03-21 LAB — CBC
HCT: 31.6 % — ABNORMAL LOW (ref 36.0–46.0)
Hemoglobin: 9.9 g/dL — ABNORMAL LOW (ref 12.0–15.0)
MCH: 23 pg — ABNORMAL LOW (ref 26.0–34.0)
MCHC: 31.3 g/dL (ref 30.0–36.0)
MCV: 73.3 fL — ABNORMAL LOW (ref 80.0–100.0)
Platelets: 165 10*3/uL (ref 150–400)
RBC: 4.31 MIL/uL (ref 3.87–5.11)
RDW: 14.8 % (ref 11.5–15.5)
WBC: 15.5 10*3/uL — ABNORMAL HIGH (ref 4.0–10.5)
nRBC: 0 % (ref 0.0–0.2)

## 2020-03-21 LAB — SARS CORONAVIRUS 2 BY RT PCR (HOSPITAL ORDER, PERFORMED IN ~~LOC~~ HOSPITAL LAB): SARS Coronavirus 2: NEGATIVE

## 2020-03-21 MED ORDER — LIDOCAINE HCL (PF) 1 % IJ SOLN
INTRAMUSCULAR | Status: AC
  Filled 2020-03-21: qty 30

## 2020-03-21 MED ORDER — EPHEDRINE 5 MG/ML INJ
10.0000 mg | INTRAVENOUS | Status: DC | PRN
  Filled 2020-03-21: qty 2

## 2020-03-21 MED ORDER — AMMONIA AROMATIC IN INHA
RESPIRATORY_TRACT | Status: AC
  Filled 2020-03-21: qty 10

## 2020-03-21 MED ORDER — DIPHENHYDRAMINE HCL 50 MG/ML IJ SOLN: 12.5000 mg | INTRAMUSCULAR | Status: DC | PRN

## 2020-03-21 MED ORDER — OXYTOCIN-SODIUM CHLORIDE 30-0.9 UT/500ML-% IV SOLN
2.5000 [IU]/h | INTRAVENOUS | Status: DC
  Filled 2020-03-21: qty 500

## 2020-03-21 MED ORDER — PHENYLEPHRINE 40 MCG/ML (10ML) SYRINGE FOR IV PUSH (FOR BLOOD PRESSURE SUPPORT)
80.0000 ug | PREFILLED_SYRINGE | INTRAVENOUS | Status: DC | PRN
  Filled 2020-03-21: qty 10

## 2020-03-21 MED ORDER — OXYTOCIN 10 UNIT/ML IJ SOLN
INTRAMUSCULAR | Status: AC
  Filled 2020-03-21: qty 2

## 2020-03-21 MED ORDER — LIDOCAINE HCL (PF) 1 % IJ SOLN
INTRAMUSCULAR | Status: DC | PRN
  Administered 2020-03-21: 3 mL

## 2020-03-21 MED ORDER — LACTATED RINGERS IV SOLN: INTRAVENOUS | Status: DC

## 2020-03-21 MED ORDER — LACTATED RINGERS IV SOLN: 500.0000 mL | Freq: Once | INTRAVENOUS | Status: DC

## 2020-03-21 MED ORDER — MISOPROSTOL 200 MCG PO TABS
ORAL_TABLET | ORAL | Status: AC
  Filled 2020-03-21: qty 4

## 2020-03-21 MED ORDER — LIDOCAINE-EPINEPHRINE (PF) 1.5 %-1:200000 IJ SOLN
INTRAMUSCULAR | Status: DC | PRN
  Administered 2020-03-21: 3 mL via PERINEURAL

## 2020-03-21 MED ORDER — FENTANYL 2.5 MCG/ML W/ROPIVACAINE 0.15% IN NS 100 ML EPIDURAL (ARMC)
12.0000 mL/h | EPIDURAL | Status: DC
Start: 2020-03-21 — End: 2020-03-22
  Administered 2020-03-21: 12 mL/h via EPIDURAL

## 2020-03-21 MED ORDER — FENTANYL CITRATE (PF) 100 MCG/2ML IJ SOLN: 50.0000 ug | INTRAMUSCULAR | Status: DC | PRN

## 2020-03-21 MED ORDER — LIDOCAINE HCL (PF) 1 % IJ SOLN
30.0000 mL | INTRAMUSCULAR | Status: DC | PRN
Start: 2020-03-21 — End: 2020-03-22

## 2020-03-21 MED ORDER — SOD CITRATE-CITRIC ACID 500-334 MG/5ML PO SOLN: 30.0000 mL | ORAL | Status: DC | PRN

## 2020-03-21 MED ORDER — LACTATED RINGERS IV SOLN: 500.0000 mL | INTRAVENOUS | Status: DC | PRN

## 2020-03-21 MED ORDER — ONDANSETRON HCL 4 MG/2ML IJ SOLN
4.0000 mg | Freq: Four times a day (QID) | INTRAMUSCULAR | Status: DC | PRN
  Administered 2020-03-21: 4 mg via INTRAVENOUS
  Filled 2020-03-21: qty 2

## 2020-03-21 MED ORDER — BUPIVACAINE HCL (PF) 0.25 % IJ SOLN
INTRAMUSCULAR | Status: DC | PRN
  Administered 2020-03-21: 7 mL via EPIDURAL

## 2020-03-21 MED ORDER — OXYTOCIN BOLUS FROM INFUSION
333.0000 mL | Freq: Once | INTRAVENOUS | Status: AC
  Administered 2020-03-21: 333 mL via INTRAVENOUS

## 2020-03-21 MED ORDER — ACETAMINOPHEN 325 MG PO TABS: 650.0000 mg | ORAL_TABLET | ORAL | Status: DC | PRN

## 2020-03-21 MED ORDER — FENTANYL 2.5 MCG/ML W/ROPIVACAINE 0.15% IN NS 100 ML EPIDURAL (ARMC)
EPIDURAL | Status: AC
  Filled 2020-03-21: qty 100

## 2020-03-21 NOTE — OB Triage Note (Signed)
Pt is a 24y/o G3P2 at [redacted]w[redacted]d with c/o contractions every 4 min. Pt states she has been contracting since yesterday buit had her membranes swept in the office today and ctx increased in intensity. Pt states +FM. Pt denies LOF, and VB. Monitors applied and assessing. Initial FHT 140.

## 2020-03-21 NOTE — Progress Notes (Signed)
Labor Progress Note  Angela Levy is a 25 y.o. G3P2002 at [redacted]w[redacted]d by LMP admitted for active labor  Subjective: comfortable after epidural, feeling rectal pressure.   Objective: BP 101/65   Pulse (!) 108   Temp 98.2 F (36.8 C) (Oral)   Resp 16   Ht 5\' 7"  (1.702 m)   Wt 77.1 kg   LMP 07/05/2019   SpO2 100%   BMI 26.63 kg/m  Notable VS details: reviewed.   Fetal Assessment: FHT:  FHR: 140 bpm, variability: moderate,  accelerations:  Present,  decelerations:  Present 2 min prolonged variable decel.  Category/reactivity:  Category II UC:   regular, every 3-4 minutes SVE:   7/C/0 with BBOW.  AROM performed, small amt clear fluid.  Membrane status: AROM at 2233 Amniotic color: clear  Labs: Lab Results  Component Value Date   WBC 15.5 (H) 03/21/2020   HGB 9.9 (L) 03/21/2020   HCT 31.6 (L) 03/21/2020   MCV 73.3 (L) 03/21/2020   PLT 165 03/21/2020    Assessment / Plan: Spontaneous labor, progressing normally  Labor: augment with AROM Preeclampsia:  no e/o pre-E Fetal Wellbeing:  Category II - Decel noted x 1.  Pain Control:  Epidural I/D:  n/a Anticipated MOD:  NSVD  Francetta Found, CNM 03/21/2020, 10:37 PM

## 2020-03-21 NOTE — H&P (Signed)
OB History & Physical   History of Present Illness:  Chief Complaint: more painful UCs since membrane sweep in office today.   HPI:  Angela Levy is a 25 y.o. G59P2002 female at [redacted]w[redacted]d dated by LMP and c/w [redacted]w[redacted]d [place].  She presents to L&D for more painful UCs since this afternoon. Reports active FM, denies LOF. Has had some pink mucus.    Pregnancy Issues: 1. Uterine S<D - 01/29/20 EFW 1541g (50%) 2. Iron deficiency anemia in pregnancy  3. History of IUGR 4. History of POTS  5. Chronic back/sciatic pain 6. Preterm contractions with dilation to 3.5cm 7. RH negative    Maternal Medical History:   Past Medical History:  Diagnosis Date  . Anxiety   . Migraines   . POTS (postural orthostatic tachycardia syndrome)   . Sciatica of right side   . Scoliosis     Past Surgical History:  Procedure Laterality Date  . NO PAST SURGERIES      No Known Allergies  Prior to Admission medications   Medication Sig Start Date End Date Taking? Authorizing Provider  acetaminophen (TYLENOL) 500 MG tablet Take 2 tablets (1,000 mg total) by mouth every 6 (six) hours as needed (for pain scale < 4ORtemperature>/=100.5 F). 02/23/20  Yes Xaiden Fleig, Murray Hodgkins, CNM  famotidine (PEPCID) 10 MG tablet Take 10 mg by mouth 2 (two) times daily.   Yes [provider]  Prenatal Vit-Fe Fumarate-FA (PRENATAL MULTIVITAMIN) TABS tablet Take 1 tablet by mouth daily at 12 noon. 06/16/16  Yes Schermerhorn, [name], MD  calcium carbonate (TUMS - DOSED IN MG ELEMENTAL CALCIUM) 500 MG chewable tablet Chew 2 tablets (400 mg of elemental calcium total) by mouth every 4 (four) hours as needed for indigestion. Patient not taking: No sig reported 02/23/20   Daniella Dewberry, Murray Hodgkins, CNM  ferrous sulfate 325 (65 FE) MG tablet Take 1 tablet (325 mg total) by mouth daily with breakfast. Take with Vitamin C 01/05/18 03/06/18  [name], MD  hydrOXYzine (VISTARIL) 50 MG capsule Take 1 capsule (50 mg total) by mouth daily as  needed (sleep). Patient not taking: Reported on 03/21/2020 03/10/20   Minda Meo, CNM     Prenatal care site: [place] History: She  reports that she has never smoked. She has never used smokeless tobacco. She reports that she does not drink alcohol and does not use drugs.  Family History: no family Hx Gyn cancers  Review of Systems: A full review of systems was performed and negative except as noted in the HPI.     Physical Exam:  Vital Signs: BP 113/70 (BP Location: Left Arm)   Pulse (!) 115   Temp 98.2 F (36.8 C) (Oral)   Resp 16   Ht 5\' 7"  (1.702 m)   Wt 77.1 kg   LMP 07/05/2019   BMI 26.63 kg/m  General: no acute distress.  HEENT: normocephalic, atraumatic Heart: regular rate & rhythm.  No murmurs/rubs/gallops Lungs: clear to auscultation bilaterally, normal respiratory effort Abdomen: soft, gravid, non-tender;  EFW: 6lbs Pelvic:   External: Normal external female genitalia  Cervix: Dilation: 5 / Effacement (%): 70,80 / Station: 0    Extremities: non-tender, symmetric, no edema bilaterally.  DTRs: 2+  Neurologic: Alert & oriented x 3.    No results found for this or any previous visit (from the past 24 hour(s)).  Pertinent Results:  Prenatal Labs: Blood type/Rh  A negative  Antibody screen Neg, rhogam given 01/15/20  Rubella Immune  Varicella Immune  RPR NR  HBsAg Neg  HIV NR  GC neg  Chlamydia neg  Genetic screening Negative materniT21 and neg AFP  1 hour GTT  76  3 hour GTT  n/a  GBS  negative, done 02/22/20   FHT: 140bpm, mod variability, + accels, no decels TOCO: q2-87min SVE:  Dilation: 5 / Effacement (%): 70,80 / Station: 0    Cephalic by leopolds and SVE  US OB Limited  Result Date: 03/10/2020 CLINICAL DATA:  Pre term contractions EXAM: LIMITED OBSTETRIC ULTRASOUND AND BIOPHYSICAL PROFILE FINDINGS: Number of Fetuses: 1 Heart Rate:  133 bpm Movement: Yes Presentation: Cephalic Placental Location: Fundal Previa: No Amniotic  Fluid (Subjective):  Within normal limits. AFI: 12.8 cm FL:  6.2cm 32w 0d MATERNAL FINDINGS: Cervix:  Closed, measures 3.5 cm. Uterus/Adnexae: No abnormality visualized. Movement:  2  Time: 30 minutes Breathing: 2 Tone:  2 Amniotic Fluid: 2 Total Score:  8 IMPRESSION: 1. Single live intrauterine pregnancy as above estimated age 44 weeks and 0 days. 2. Normal amniotic fluid index measuring 12.8 cm. 3. Biophysical profile score is 8 out of 8. This exam is performed on an emergent basis and does not comprehensively evaluate fetal size, dating, or anatomy; follow-up complete OB US should be considered if further fetal assessment is warranted. Electronically Signed   By: Randa Ngo M.D.   On: 03/10/2020 15:03   US FETAL BPP W/NONSTRESS  Result Date: 03/10/2020 CLINICAL DATA:  Pre term contractions EXAM: LIMITED OBSTETRIC ULTRASOUND AND BIOPHYSICAL PROFILE FINDINGS: Number of Fetuses: 1 Heart Rate:  133 bpm Movement: Yes Presentation: Cephalic Placental Location: Fundal Previa: No Amniotic Fluid (Subjective):  Within normal limits. AFI: 12.8 cm FL:  6.2cm 32w 0d MATERNAL FINDINGS: Cervix:  Closed, measures 3.5 cm. Uterus/Adnexae: No abnormality visualized. Movement:  2  Time: 30 minutes Breathing: 2 Tone:  2 Amniotic Fluid: 2 Total Score:  8 IMPRESSION: 1. Single live intrauterine pregnancy as above estimated age 44 weeks and 0 days. 2. Normal amniotic fluid index measuring 12.8 cm. 3. Biophysical profile score is 8 out of 8. This exam is performed on an emergent basis and does not comprehensively evaluate fetal size, dating, or anatomy; follow-up complete OB US should be considered if further fetal assessment is warranted. Electronically Signed   By: Randa Ngo M.D.   On: 03/10/2020 15:03    Assessment:  Angela Levy is a 25 y.o. G10P2002 female at [redacted]w[redacted]d with active labor.   Plan:  1. Admit to Labor & Delivery; consents reviewed and obtained - COVID swab on admit.   2. Fetal Well being  - Fetal  Tracing: cat I tracing - Group B Streptococcus ppx indicated: negative - Presentation: cephalic confirmed by exam   3. Routine OB: - Prenatal labs reviewed, as above - Rh A Neg - CBC, T&S, RPR on admit - Clear fluids, IVF  4. Monitoring of Labor:  -  Contractions: external toco in place -  Pelvis proven to 3250g -  Plan for AROM if no cervical change after epidural.  -  Plan for continuous fetal monitoring  -  Maternal pain control as desired - Anticipate vaginal delivery  5. Post Partum Planning: - Infant feeding: breast - Contraception: nonhormonal - Tdap 01/15/20 - Flu declined  Francetta Found, CNM 03/21/20 8:32 PM

## 2020-03-21 NOTE — Discharge Summary (Signed)
Obstetrical Discharge Summary  Patient Name: Angela Levy DOB: 1995/06/22 MRN: [id number]  Date of Admission: 03/21/2020 Date of Delivery: 03/21/20 Delivered by: Dr Vikki Ports Ward Date of Discharge: 03/23/2020  Primary OB: [place]  FVC:BSWHQPR'F last menstrual period was 07/05/2019. EDC Estimated Date of Delivery: 04/08/20 Gestational Age at Delivery: [redacted]w[redacted]d   Antepartum complications:  1. Uterine S<D - 01/29/20 EFW 1541g (50%) 2. Iron deficiency anemia in pregnancy  3. History of IUGR 4. History of POTS  5. Chronic back/sciatic pain 6. Preterm contractions with dilation to 3.5cm 7. RH negative  Admitting Diagnosis:  Secondary Diagnosis: Patient Active Problem List   Diagnosis Date Noted  . Labor and delivery, indication for care 03/21/2020  . Preterm uterine contractions in third trimester, antepartum 03/07/2020  . Threatened preterm labor, third trimester 02/22/2020  . Uterine size date discrepancy, third trimester 02/22/2020  . Preterm contractions 01/17/2020  . Encounter for supervision of normal pregnancy in third trimester 09/20/2019  . IUGR (intrauterine growth restriction) affecting care of mother 06/13/2016  . Rh negative status during pregnancy 12/02/2015  . Adolescent idiopathic scoliosis of lumbar region 10/09/2014  . Migraine without aura and without status migrainosus, not intractable 10/09/2014  . Hx of esophagitis 06/08/2014  . Hx of tinea corporis 06/08/2014    Augmentation: AROM Complications: None Intrapartum complications/course: see delivery note Date of Delivery: 03/21/20 Delivered By: Dr Vikki Ports Ward Delivery Type: spontaneous vaginal delivery Anesthesia: epidural Placenta: spontaneous Laceration: none Episiotomy: none Newborn Data: Live born female "Frankey Poot" Birth Weight:  6#4 APGAR: 8, 9  Newborn Delivery   Birth date/time: 03/21/2020 23:35:00 Delivery type: Vaginal, Spontaneous      Postpartum Procedures:   Flavia Shipper:  Edinburgh  Postnatal Depression Scale Screening Tool 03/22/2020  I have been able to laugh and see the funny side of things. 0  I have looked forward with enjoyment to things. 0  I have blamed myself unnecessarily when things went wrong. 0  I have been anxious or worried for no good reason. 1  I have felt scared or panicky for no good reason. 0  Things have been getting on top of me. 1  I have been so unhappy that I have had difficulty sleeping. 0  I have felt sad or miserable. 1  I have been so unhappy that I have been crying. 0  The thought of harming myself has occurred to me. 0  Edinburgh Postnatal Depression Scale Total 3      Post partum course:  Patient had an uncomplicated postpartum course.  By time of discharge on PPD#2, her pain was controlled on oral pain medications; she had appropriate lochia and was ambulating, voiding without difficulty and tolerating regular diet.  She was deemed stable for discharge to home.     Discharge Physical Exam:  BP 111/62 (BP Location: Left Arm)   Pulse 70   Temp 97.6 F (36.4 C) (Oral)   Resp 18   Ht 5\' 7"  (1.702 m)   Wt 77.1 kg   LMP 07/05/2019   SpO2 100%   Breastfeeding Unknown   BMI 26.63 kg/m   General: NAD CV: RRR Pulm: CTABL, nl effort ABD: s/nd/nt, fundus firm and below the umbilicus Lochia: moderate Perineum: well approximated/intact DVT Evaluation: LE non-ttp, no evidence of DVT on exam.  Hemoglobin  Date Value Ref Range Status  03/22/2020 10.1 (L) 12.0 - 15.0 g/dL Final   HGB  Date Value Ref Range Status  12/09/2012 12.5 12.0 - 16.0 g/dL Final  HCT  Date Value Ref Range Status  03/22/2020 31.4 (L) 36.0 - 46.0 % Final  12/09/2012 37.0 35.0 - 47.0 % Final     Disposition: stable, discharge to home. Baby Feeding: formula Baby Disposition: home with mom  Rh Immune globulin given:  Rubella vaccine given: immune Varicella vaccine given: immune Tdap vaccine given in AP or PP setting: 01/15/20 Flu vaccine given in AP  or PP setting: declined  Contraception:  spermacide/NFP  Prenatal Labs:  Blood type/Rh  A negative  Antibody screen Neg, rhogam given 01/15/20  Rubella Immune  Varicella Immune  RPR NR  HBsAg Neg  HIV NR  GC neg  Chlamydia neg  Genetic screening Negative materniT21 and neg AFP  1 hour GTT  76  3 hour GTT  n/a  GBS  negative, done 02/22/20      Plan:  Angela Levy was discharged to home in good condition. Follow-up appointment with delivering provider in 6 weeks.  Discharge Medications: Allergies as of 03/23/2020   No Known Allergies     Medication List    STOP taking these medications   ferrous sulfate 325 (65 FE) MG tablet   hydrOXYzine 50 MG capsule Commonly known as: Vistaril     TAKE these medications   acetaminophen 325 MG tablet Commonly known as: Tylenol Take 2 tablets (650 mg total) by mouth every 4 (four) hours as needed (for pain scale < 4). What changed:   medication strength  how much to take  when to take this  reasons to take this   benzocaine-Menthol 20-0.5 % Aero Commonly known as: DERMOPLAST Apply 1 application topically as needed for irritation (perineal discomfort).   calcium carbonate 500 MG chewable tablet Commonly known as: TUMS - dosed in mg elemental calcium Chew 2 tablets (400 mg of elemental calcium total) by mouth every 4 (four) hours as needed for indigestion.   coconut oil Oil Apply 1 application topically as needed.   famotidine 10 MG tablet Commonly known as: PEPCID Take 10 mg by mouth 2 (two) times daily.   ibuprofen 600 MG tablet Commonly known as: ADVIL Take 1 tablet (600 mg total) by mouth every 6 (six) hours.   prenatal multivitamin Tabs tablet Take 1 tablet by mouth daily at 12 noon.   senna-docusate 8.6-50 MG tablet Commonly known as: Senokot-S Take 2 tablets by mouth daily. Start taking on: March 24, 2020   witch hazel-glycerin pad Commonly known as: TUCKS Apply 1 application topically as needed  for hemorrhoids.        Follow-up Information    Ward, Honor Loh, MD Follow up in 6 week(s).   Specialty: Obstetrics and Gynecology Why: routine Postpartum visit.  Contact information: [place] [address] [phone]               Signed:  Francetta Found, CNM 03/23/2020  11:39 AM

## 2020-03-21 NOTE — Anesthesia Procedure Notes (Signed)
Epidural Patient location during procedure: OB Start time: 03/21/2020 9:02 PM End time: 03/21/2020 9:53 PM  Staffing Anesthesiologist: Alvin Critchley, MD Performed: anesthesiologist   Preanesthetic Checklist Completed: patient identified, IV checked, site marked, risks and benefits discussed, surgical consent, monitors and equipment checked, pre-op evaluation and timeout performed  Epidural Patient position: sitting Prep: Betadine Patient monitoring: heart rate, continuous pulse ox and blood pressure Approach: midline Location: L3-L4 Injection technique: LOR air  Needle:  Needle type: Tuohy  Needle gauge: 17 G Needle length: 9 cm and 9 Catheter type: closed end flexible Catheter size: 19 Gauge Test dose: negative and 1.5% lidocaine with Epi 1:200 K  Assessment Events: blood not aspirated, injection not painful, no injection resistance, no paresthesia and negative IV test  Additional Notes Time out called.  Patient placed in sitting position.  Back prepped and draped in sterile fashion.  A skin wheal was made in the L3-L4 interspace with 1% Lidocaine plain.  A 17G tuohy needle was advanced into the epidural space with a loss of resistance technique.  The epidural catheter was threaded 3cm and the TD was negative.  No blood, fluid or paresthesias.  The catheter was affixed to the back in sterile fashion.Reason for block:procedure for pain

## 2020-03-21 NOTE — Anesthesia Preprocedure Evaluation (Signed)
Anesthesia Evaluation  Patient identified by MRN, date of birth, ID band Patient awake    Reviewed: Allergy & Precautions, NPO status , Patient's Chart, lab work & pertinent test results, reviewed documented beta blocker date and time   Airway Mallampati: II  TM Distance: >3 FB     Dental  (+) Chipped   Pulmonary neg pulmonary ROS,           Cardiovascular negative cardio ROS       Neuro/Psych  Headaches, Anxiety  Neuromuscular disease    GI/Hepatic negative GI ROS, Neg liver ROS,   Endo/Other  negative endocrine ROS  Renal/GU negative Renal ROS  negative genitourinary   Musculoskeletal   Abdominal   Peds negative pediatric ROS (+)  Hematology negative hematology ROS (+)   Anesthesia Other Findings Past Medical History: No date: Anxiety No date: Migraines No date: POTS (postural orthostatic tachycardia syndrome) No date: Sciatica of right side No date: Scoliosis  Reproductive/Obstetrics (+) Pregnancy                             Anesthesia Physical  Anesthesia Plan  ASA: II  Anesthesia Plan: Epidural   Post-op Pain Management:    Induction:   PONV Risk Score and Plan:   Airway Management Planned: Natural Airway  Additional Equipment:   Intra-op Plan:   Post-operative Plan:   Informed Consent: I have reviewed the patients History and Physical, chart, labs and discussed the procedure including the risks, benefits and alternatives for the proposed anesthesia with the patient or authorized representative who has indicated his/her understanding and acceptance.       Plan Discussed with: CRNA  Anesthesia Plan Comments:         Anesthesia Quick Evaluation

## 2020-03-22 ENCOUNTER — Encounter: Payer: Self-pay | Admitting: Obstetrics & Gynecology

## 2020-03-22 LAB — CBC
HCT: 31.4 % — ABNORMAL LOW (ref 36.0–46.0)
Hemoglobin: 10.1 g/dL — ABNORMAL LOW (ref 12.0–15.0)
MCH: 23.6 pg — ABNORMAL LOW (ref 26.0–34.0)
MCHC: 32.2 g/dL (ref 30.0–36.0)
MCV: 73.4 fL — ABNORMAL LOW (ref 80.0–100.0)
Platelets: 145 10*3/uL — ABNORMAL LOW (ref 150–400)
RBC: 4.28 MIL/uL (ref 3.87–5.11)
RDW: 15.2 % (ref 11.5–15.5)
WBC: 17.5 10*3/uL — ABNORMAL HIGH (ref 4.0–10.5)
nRBC: 0 % (ref 0.0–0.2)

## 2020-03-22 LAB — RPR: RPR Ser Ql: NONREACTIVE

## 2020-03-22 LAB — FETAL SCREEN: Fetal Screen: NEGATIVE

## 2020-03-22 MED ORDER — DIBUCAINE (PERIANAL) 1 % EX OINT: 1.0000 "application " | TOPICAL_OINTMENT | CUTANEOUS | Status: DC | PRN

## 2020-03-22 MED ORDER — RHO D IMMUNE GLOBULIN 1500 UNIT/2ML IJ SOSY
300.0000 ug | PREFILLED_SYRINGE | Freq: Once | INTRAMUSCULAR | Status: AC
  Administered 2020-03-22: 300 ug via INTRAVENOUS
  Filled 2020-03-22: qty 2

## 2020-03-22 MED ORDER — FERROUS SULFATE 325 (65 FE) MG PO TABS
325.0000 mg | ORAL_TABLET | Freq: Two times a day (BID) | ORAL | Status: DC
  Filled 2020-03-22 (×2): qty 1

## 2020-03-22 MED ORDER — FAMOTIDINE 20 MG PO TABS
10.0000 mg | ORAL_TABLET | Freq: Two times a day (BID) | ORAL | Status: DC
  Administered 2020-03-22: 10 mg via ORAL
  Filled 2020-03-22 (×3): qty 1

## 2020-03-22 MED ORDER — ZOLPIDEM TARTRATE 5 MG PO TABS: 5.0000 mg | ORAL_TABLET | Freq: Every evening | ORAL | Status: DC | PRN

## 2020-03-22 MED ORDER — ACETAMINOPHEN 325 MG PO TABS
650.0000 mg | ORAL_TABLET | ORAL | Status: DC | PRN
  Administered 2020-03-22 – 2020-03-23 (×5): 650 mg via ORAL
  Filled 2020-03-22 (×5): qty 2

## 2020-03-22 MED ORDER — IBUPROFEN 600 MG PO TABS
600.0000 mg | ORAL_TABLET | Freq: Four times a day (QID) | ORAL | Status: DC
  Administered 2020-03-22: 600 mg via ORAL

## 2020-03-22 MED ORDER — SIMETHICONE 80 MG PO CHEW: 80.0000 mg | CHEWABLE_TABLET | ORAL | Status: DC | PRN

## 2020-03-22 MED ORDER — DIPHENHYDRAMINE HCL 25 MG PO CAPS: 25.0000 mg | ORAL_CAPSULE | Freq: Four times a day (QID) | ORAL | Status: DC | PRN

## 2020-03-22 MED ORDER — IBUPROFEN 600 MG PO TABS
600.0000 mg | ORAL_TABLET | Freq: Four times a day (QID) | ORAL | Status: DC
  Administered 2020-03-22 – 2020-03-23 (×5): 600 mg via ORAL
  Filled 2020-03-22 (×5): qty 1

## 2020-03-22 MED ORDER — WITCH HAZEL-GLYCERIN EX PADS: 1.0000 "application " | MEDICATED_PAD | CUTANEOUS | Status: DC | PRN

## 2020-03-22 MED ORDER — ONDANSETRON HCL 4 MG/2ML IJ SOLN: 4.0000 mg | INTRAMUSCULAR | Status: DC | PRN

## 2020-03-22 MED ORDER — BENZOCAINE-MENTHOL 20-0.5 % EX AERO: 1.0000 "application " | INHALATION_SPRAY | CUTANEOUS | Status: DC | PRN

## 2020-03-22 MED ORDER — SENNOSIDES-DOCUSATE SODIUM 8.6-50 MG PO TABS
2.0000 | ORAL_TABLET | Freq: Every day | ORAL | Status: DC
  Administered 2020-03-22 – 2020-03-23 (×2): 2 via ORAL
  Filled 2020-03-22 (×2): qty 2

## 2020-03-22 MED ORDER — ONDANSETRON HCL 4 MG PO TABS: 4.0000 mg | ORAL_TABLET | ORAL | Status: DC | PRN

## 2020-03-22 MED ORDER — PRENATAL MULTIVITAMIN CH
1.0000 | ORAL_TABLET | Freq: Every day | ORAL | Status: DC
  Administered 2020-03-22 – 2020-03-23 (×2): 1 via ORAL
  Filled 2020-03-22 (×2): qty 1

## 2020-03-22 MED ORDER — COCONUT OIL OIL: 1.0000 "application " | TOPICAL_OIL | Status: DC | PRN

## 2020-03-22 NOTE — Anesthesia Postprocedure Evaluation (Signed)
Anesthesia Post Note  Patient: Angela Levy  Procedure(s) Performed: AN AD HOC LABOR EPIDURAL  Patient location during evaluation: Mother Baby Anesthesia Type: Epidural Level of consciousness: awake and alert Pain management: pain level controlled Vital Signs Assessment: post-procedure vital signs reviewed and stable Respiratory status: spontaneous breathing, nonlabored ventilation and respiratory function stable Cardiovascular status: stable Postop Assessment: no headache, no backache and epidural receding Anesthetic complications: no   No complications documented.   Last Vitals:  Vitals:   03/22/20 0231 03/22/20 0348  BP: 112/62 116/62  Pulse: 82 94  Resp: 18 18  Temp: 36.8 C 36.7 C  SpO2: 98% 97%    Last Pain:  Vitals:   03/22/20 0635  TempSrc:   PainSc: [place]

## 2020-03-22 NOTE — Progress Notes (Signed)
Post Partum Day 1  Subjective: no complaints, up ad lib, voiding and tolerating PO  Doing well, no concerns. Ambulating without difficulty, pain managed with PO meds, tolerating regular diet, and voiding without difficulty.   No fever/chills, chest pain, shortness of breath, nausea/vomiting, or leg pain. No nipple or breast pain. No headache, visual changes, or RUQ/epigastric pain.  Objective: BP 104/63 (BP Location: Right Arm)   Pulse 77   Temp 97.7 F (36.5 C) (Axillary)   Resp 20   Ht 5\' 7"  (1.702 m)   Wt 77.1 kg   LMP 07/05/2019   SpO2 98%   Breastfeeding Unknown   BMI 26.63 kg/m    Physical Exam:  General: alert, cooperative and no distress Breasts: soft/nontender CV: RRR Pulm: nl effort, CTABL Abdomen: soft, non-tender, active bowel sounds Uterine Fundus: firm Perineum: minimal edema, intact Lochia: appropriate DVT Evaluation: No evidence of DVT seen on physical exam.  Recent Labs    03/21/20 2027 03/22/20 0637  HGB 9.9* 10.1*  HCT 31.6* 31.4*  WBC 15.5* 17.5*  PLT 165 145*    Assessment/Plan: 25 y.o. G3P3003 postpartum day # 1  -Continue routine postpartum care -Lactation consult PRN for breastfeeding -Discussed contraceptive options including implant, IUDs hormonal and non-hormonal, injection, pills/ring/patch, condoms, and NFP.  -Iron deficiency anemia - hemodynamically stable and asymptomatic; start PO ferrous sulfate BID with stool softeners   Disposition: Continue inpatient postpartum care    LOS: 1 day   Minda Meo, CNM 03/22/2020, 8:42 AM   ----- Drinda Butts  Certified Nurse Midwife [facility]

## 2020-03-23 LAB — RHOGAM INJECTION: Unit division: 0

## 2020-03-23 MED ORDER — ACETAMINOPHEN 325 MG PO TABS: 650.0000 mg | ORAL_TABLET | ORAL | Status: DC | PRN

## 2020-03-23 MED ORDER — WITCH HAZEL-GLYCERIN EX PADS: 1.0000 "application " | MEDICATED_PAD | CUTANEOUS | 12 refills | Status: DC | PRN

## 2020-03-23 MED ORDER — BENZOCAINE-MENTHOL 20-0.5 % EX AERO: 1.0000 "application " | INHALATION_SPRAY | CUTANEOUS | Status: DC | PRN

## 2020-03-23 MED ORDER — SENNOSIDES-DOCUSATE SODIUM 8.6-50 MG PO TABS: 2.0000 | ORAL_TABLET | Freq: Every day | ORAL | 0 refills | Status: DC

## 2020-03-23 MED ORDER — IBUPROFEN 600 MG PO TABS: 600.0000 mg | ORAL_TABLET | Freq: Four times a day (QID) | ORAL | 0 refills | Status: DC

## 2020-03-23 MED ORDER — COCONUT OIL OIL: 1.0000 "application " | TOPICAL_OIL | 0 refills | Status: DC | PRN

## 2020-03-23 NOTE — Progress Notes (Signed)
Newborn discharged home.  Discharge instructions and appointment given to and reviewed with parent.  Parent verbalized understanding. All testing completed. Tag removed, bands matched. Escorted by auxillary, carseat present.  

## 2020-03-23 NOTE — Discharge Instructions (Signed)
Postpartum Care After Vaginal Delivery The following information offers guidance about how to care for yourself from the time you deliver your baby to 6-12 weeks after delivery (postpartum period). If you have problems or questions, contact your health care provider for more specific instructions. Follow these instructions at home: Vaginal bleeding  It is normal to have vaginal bleeding (lochia) after delivery. Wear a sanitary pad for bleeding and discharge. ? During the first week after delivery, the amount and appearance of lochia is often similar to a menstrual period. ? Over the next few weeks, it will gradually decrease to a dry, yellow-brown discharge. ? For most women, lochia stops completely by 4-6 weeks after delivery, but can vary.  Change your sanitary pads frequently. Watch for any changes in your flow, such as: ? A sudden increase in volume. ? A change in color. ? Large blood clots.  If you pass a blood clot from your vagina, save it and call your health care provider. Do not flush blood clots down the toilet before talking with your health care provider.  Do not use tampons or douches until your health care provider approves.  If you are not breastfeeding, your period should return 6-8 weeks after delivery. If you are feeding your baby breast milk only, your period may not return until you stop breastfeeding. Perineal care  Keep the area between the vagina and the anus (perineum) clean and dry. Use medicated pads and pain-relieving sprays and creams as directed.  If you had a surgical cut in the perineum (episiotomy) or a tear, check the area for signs of infection until you are healed. Check for: ? More redness, swelling, or pain. ? Fluid or blood coming from the cut or tear. ? Warmth. ? Pus or a bad smell.  You may be given a squirt bottle to use instead of wiping to clean the perineum area after you use the bathroom. Pat the area gently to dry it.  To relieve pain  caused by an episiotomy, a tear, or swollen veins in the anus (hemorrhoids), take a warm sitz bath 2-3 times a day. In a sitz bath, the warm water should only come up to your hips and cover your buttocks.   Breast care  In the first few days after delivery, your breasts may feel heavy, full, and uncomfortable (breast engorgement). Milk may also leak from your breasts. Ask your health care provider about ways to help relieve the discomfort.  If you are breastfeeding: ? Wear a bra that supports your breasts and fits well. Use breast pads to absorb milk that leaks. ? Keep your nipples clean and dry. Apply creams and ointments as told. ? You may have uterine contractions every time you breastfeed for up to several weeks after delivery. This helps your uterus return to its normal size. ? If you have any problems with breastfeeding, notify your health care provider or lactation consultant.  If you are not breastfeeding: ? Avoid touching your breasts. Do not squeeze out (express) milk. Doing this can make your breasts produce more milk. ? Wear a good-fitting bra and use cold packs to help with swelling. Intimacy and sexuality  Ask your health care provider when you can engage in sexual activity. This may depend upon: ? Your risk of infection. ? How fast you are healing. ? Your comfort and desire to engage in sexual activity.  You are able to get pregnant after delivery, even if you have not had your period. Talk with  your health care provider about methods of birth control (contraception) or family planning if you desire future pregnancies. Medicines  Take over-the-counter and prescription medicines only as told by your health care provider.  Take an over-the-counter stool softener to help ease bowel movements as told by your health care provider.  If you were prescribed an antibiotic medicine, take it as told by your health care provider. Do not stop taking the antibiotic even if you start to  feel better.  Review all previous and current prescriptions to check for possible transfer into breast milk. Activity  Gradually return to your normal activities as told by your health care provider.  Rest as much as possible. Nap while your baby is sleeping. Eating and drinking  Drink enough fluid to keep your urine pale yellow.  To help prevent or relieve constipation, eat high-fiber foods every day.  Choose healthy eating to support breastfeeding or weight loss goals.  Take your prenatal vitamins until your health care provider tells you to stop.   General tips/recommendations  Do not use any products that contain nicotine or tobacco. These products include cigarettes, chewing tobacco, and vaping devices, such as e-cigarettes. If you need help quitting, ask your health care provider.  Do not drink alcohol, especially if you are breastfeeding.  Do not take medications or drugs that are not prescribed to you, especially if you are breastfeeding.  Visit your health care provider for a postpartum checkup within the first 3-6 weeks after delivery.  Complete a comprehensive postpartum visit no later than 12 weeks after delivery.  Keep all follow-up visits for you and your baby. Contact a health care provider if:  You feel unusually sad or worried.  Your breasts become red, painful, or hard.  You have a fever or other signs of an infection.  You have bleeding that is soaking through one pad an hour or you have blood clots.  You have a severe headache that doesn't go away or you have vision changes.  You have nausea and vomiting and are unable to eat or drink anything for 24 hours. Get help right away if:  You have chest pain or difficulty breathing.  You have sudden, severe leg pain.  You faint or have a seizure.  You have thoughts about hurting yourself or your baby. If you ever feel like you may hurt yourself or others, or have thoughts about taking your own life,  get help right away. Go to your nearest emergency department or:  Call your local emergency services (911 in the [place]  The National Suicide Prevention Lifeline at 403-550-8164. This suicide crisis helpline is open 24 hours a day.  Text the Crisis Text Line at 418 830 8429 (in the [place].). Summary  The period of time after you deliver your newborn up to 6-12 weeks after delivery is called the postpartum period.  Keep all follow-up visits for you and your baby.  Review all previous and current prescriptions to check for possible transfer into breast milk.  Contact a health care provider if you feel unusually sad or worried during the postpartum period. This information is not intended to replace advice given to you by your health care provider. Make sure you discuss any questions you have with your health care provider. Document Revised: 10/19/2019 Document Reviewed: 10/19/2019 Elsevier Patient Education  2021 [employer].   Postpartum Baby Blues The postpartum period begins right after the birth of a baby. During this time, there is often joy and excitement. It is  also a time of many changes in the life of the parents. A mother may feel happy one minute and sad or stressed the next. These feelings of sadness, called the baby blues, usually happen in the period right after the baby is born and go away within a week or two. What are the causes? The exact cause of this condition is not known. Changes in hormone levels after childbirth are believed to trigger some of the symptoms. Other factors that can play a role in these mood changes include:  Lack of sleep.  Stressful life events, such as financial problems, caring for a loved one, or death of a loved one.  Genetics. What are the signs or symptoms? Symptoms of this condition include:  Changes in mood, such as going from extreme happiness to sadness.  A decrease in concentration.  Difficulty sleeping.  Crying spells and  tearfulness.  Loss of appetite.  Irritability.  Anxiety. If these symptoms last for more than 2 weeks or become more severe, you may have postpartum depression. How is this diagnosed? This condition is diagnosed based on an evaluation of your symptoms. Your health care provider may use a screening tool that includes a list of questions to help identify a person with the baby blues or postpartum depression. How is this treated? The baby blues usually go away on their own in 1-2 weeks. Social support is often what is needed. You will be encouraged to get adequate sleep and rest. Follow these instructions at home: Lifestyle  Get as much rest as you can. Take a nap when the baby sleeps.  Exercise regularly as told by your health care provider. Some women find yoga and walking to be helpful.  Eat a balanced and nourishing diet. This includes plenty of fruits and vegetables, whole grains, and lean proteins.  Do little things that you enjoy. Take a bubble bath, read your favorite magazine, or listen to your favorite music.  Avoid alcohol.  Ask for help with household chores, cooking, grocery shopping, or running errands. Do not try to do everything yourself. Consider hiring a postpartum doula to help. This is a professional who specializes in providing support to new mothers.  Try not to make any major life changes during pregnancy or right after giving birth. This can add stress.      General instructions  Talk to people close to you about how you are feeling. Get support from your partner, family members, friends, or other new moms. You may want to join a support group.  Find ways to manage stress. This may include: ? Writing your thoughts and feelings in a journal. ? Spending time outside. ? Spending time with people who make you laugh.  Try to stay positive in how you think. Think about the things you are grateful for.  Take over-the-counter and prescription medicines only as  told by your health care provider.  Let your health care provider know if you have any concerns.  Keep all postpartum visits. This is important. Contact a health care provider if:  Your baby blues do not go away after 2 weeks. Get help right away if:  You have thoughts of taking your own life (suicidal thoughts), or of harming your baby or someone else.  You see or hear things that are not there (hallucinations). If you ever feel like you may hurt yourself or others, or have thoughts about taking your own life, get help right away. Go to your nearest emergency department or:  Call your local emergency services (911 in the [place]  Call a suicide crisis helpline, such as the [place], at 575 476 7368. This is open 24 hours a day in the [place]  Text the Crisis Text Line at 814-637-2294 (in the [place].). Summary  After giving birth, you may feel happy one minute and sad or stressed the next. Feelings of sadness that happen right after the baby is born and go away after a week or two are called the baby blues.  You can manage the baby blues by getting enough rest, eating a healthy diet, exercising, spending time with supportive people, and finding ways to manage stress.  If feelings of sadness and stress last longer than 2 weeks or get in the way of caring for your baby, talk with your health care provider. This may mean you have postpartum depression. This information is not intended to replace advice given to you by your health care provider. Make sure you discuss any questions you have with your health care provider. Document Revised: 07/28/2019 Document Reviewed: 07/28/2019 Elsevier Patient Education  2021 [employer].

## 2020-03-25 ENCOUNTER — Ambulatory Visit
Admission: RE | Admit: 2020-03-25 | Discharge: 2020-03-25 | Disposition: A | Discharge status: Home / Self Care | Payer: Medicaid Other | Source: Ambulatory Visit

## 2020-03-25 MED ORDER — SODIUM CHLORIDE 0.9 % IV SOLN
200.0000 mg | Freq: Once | INTRAVENOUS | Status: DC
  Filled 2020-03-25: qty 10

## 2020-04-01 ENCOUNTER — Ambulatory Visit: Payer: Medicaid Other

## 2020-04-08 ENCOUNTER — Ambulatory Visit: Admission: RE | Admit: 2020-04-08 | Payer: Medicaid Other | Source: Ambulatory Visit

## 2020-06-09 ENCOUNTER — Emergency Department
Admission: EM | Admit: 2020-06-09 | Discharge: 2020-06-09 | Disposition: A | Discharge status: Home / Self Care | Payer: Medicaid Other | Attending: Emergency Medicine | Admitting: Emergency Medicine

## 2020-06-09 ENCOUNTER — Encounter: Payer: Self-pay | Admitting: Intensive Care

## 2020-06-09 ENCOUNTER — Other Ambulatory Visit: Payer: Self-pay

## 2020-06-09 DIAGNOSIS — N939 Abnormal uterine and vaginal bleeding, unspecified: Secondary | ICD-10-CM

## 2020-06-09 HISTORY — DX: Anemia complicating pregnancy, unspecified trimester: O99.019

## 2020-06-09 LAB — COMPREHENSIVE METABOLIC PANEL
ALT: 29 U/L (ref 0–44)
AST: 23 U/L (ref 15–41)
Albumin: 4.1 g/dL (ref 3.5–5.0)
Alkaline Phosphatase: 61 U/L (ref 38–126)
Anion gap: 10 (ref 5–15)
BUN: 20 mg/dL (ref 6–20)
CO2: 22 mmol/L (ref 22–32)
Calcium: 9 mg/dL (ref 8.9–10.3)
Chloride: 107 mmol/L (ref 98–111)
Creatinine, Ser: 0.7 mg/dL (ref 0.44–1.00)
GFR, Estimated: >60 mL/min (ref >60)
Glucose, Bld: 99 mg/dL (ref 70–99)
Potassium: 3.8 mmol/L (ref 3.5–5.1)
Sodium: 139 mmol/L (ref 135–145)
Total Bilirubin: 0.9 mg/dL (ref 0.3–1.2)
Total Protein: 7.3 g/dL (ref 6.5–8.1)

## 2020-06-09 LAB — CBC WITH DIFFERENTIAL/PLATELET
Abs Immature Granulocytes: 0.01 10*3/uL (ref 0.00–0.07)
Basophils Absolute: 0 10*3/uL (ref 0.0–0.1)
Basophils Relative: 1 %
Eosinophils Absolute: 0.1 10*3/uL (ref 0.0–0.5)
Eosinophils Relative: 2 %
HCT: 40.5 % (ref 36.0–46.0)
Hemoglobin: 13.5 g/dL (ref 12.0–15.0)
Immature Granulocytes: 0 %
Lymphocytes Relative: 32 %
Lymphs Abs: 1.9 10*3/uL (ref 0.7–4.0)
MCH: 25.8 pg — ABNORMAL LOW (ref 26.0–34.0)
MCHC: 33.3 g/dL (ref 30.0–36.0)
MCV: 77.4 fL — ABNORMAL LOW (ref 80.0–100.0)
Monocytes Absolute: 0.5 10*3/uL (ref 0.1–1.0)
Monocytes Relative: 8 %
Neutro Abs: 3.3 10*3/uL (ref 1.7–7.7)
Neutrophils Relative %: 57 %
Platelets: 200 10*3/uL (ref 150–400)
RBC: 5.23 MIL/uL — ABNORMAL HIGH (ref 3.87–5.11)
RDW: 18.2 % — ABNORMAL HIGH (ref 11.5–15.5)
WBC: 5.8 10*3/uL (ref 4.0–10.5)
nRBC: 0 % (ref 0.0–0.2)

## 2020-06-09 LAB — POC URINE PREG, ED: Preg Test, Ur: NEGATIVE

## 2020-06-09 NOTE — ED Provider Notes (Signed)
[facility] Emergency Department Provider Note ____________________________________________   Event Date/Time   First MD Initiated Contact with Patient 06/09/20 [phone]     (approximate)  I have reviewed the triage vital signs and the nursing notes.  HISTORY  Chief Complaint Vaginal Bleeding   HPI Angela Levy is a 25 y.o. femalewho presents to the ED for evaluation of vaginal bleeding w clots.   Chart review indicates hx POTS, sciatica and chronic lumbar pain. Delivered vaginally on 2/3 without complications and was discharged home 2 days later on 2/5.  Patient reports following up with her OB/GYN as an outpatient about 3 weeks ago and was doing well then.  She reports starting what felt like a typical menstrual period with vaginal bleeding that lasted 1 week that following day, completing 2 weeks ago.  She then had a few days of spotting intermittently over the past 2 weeks, but started bleeding again 3 days ago.  For the past 3 days, she reports persistent and rather heavy vaginal bleeding.  She denies any abdominal pain, cramps, fevers or other discharge.  She reports that she is tolerating p.o. intake and toileting at her baseline and otherwise fine.  She reports concern that she is bleeding too much.   Past Medical History:  Diagnosis Date  . Anemia during pregnancy   . Anxiety   . Migraines   . POTS (postural orthostatic tachycardia syndrome)   . Sciatica of right side   . Scoliosis     Patient Active Problem List   Diagnosis Date Noted  . Labor and delivery, indication for care 03/21/2020  . Preterm uterine contractions in third trimester, antepartum 03/07/2020  . Threatened preterm labor, third trimester 02/22/2020  . Uterine size date discrepancy, third trimester 02/22/2020  . Preterm contractions 01/17/2020  . Encounter for supervision of normal pregnancy in third trimester 09/20/2019  . IUGR (intrauterine growth restriction) affecting  care of mother 06/13/2016  . Rh negative status during pregnancy 12/02/2015  . Adolescent idiopathic scoliosis of lumbar region 10/09/2014  . Migraine without aura and without status migrainosus, not intractable 10/09/2014  . Hx of esophagitis 06/08/2014  . Hx of tinea corporis 06/08/2014    Past Surgical History:  Procedure Laterality Date  . NO PAST SURGERIES      Prior to Admission medications   Medication Sig Start Date End Date Taking? Authorizing Provider  acetaminophen (TYLENOL) 325 MG tablet Take 2 tablets (650 mg total) by mouth every 4 (four) hours as needed (for pain scale < 4). 03/23/20   McVey, Murray Hodgkins, CNM  benzocaine-Menthol (DERMOPLAST) 20-0.5 % AERO Apply 1 application topically as needed for irritation (perineal discomfort). 03/23/20   McVey, Murray Hodgkins, CNM  calcium carbonate (TUMS - DOSED IN MG ELEMENTAL CALCIUM) 500 MG chewable tablet Chew 2 tablets (400 mg of elemental calcium total) by mouth every 4 (four) hours as needed for indigestion. Patient not taking: No sig reported 02/23/20   McVey, Wells Guiles A, CNM  coconut oil OIL Apply 1 application topically as needed. 03/23/20   McVey, Murray Hodgkins, CNM  famotidine (PEPCID) 10 MG tablet Take 10 mg by mouth 2 (two) times daily.    [provider]  ibuprofen (ADVIL) 600 MG tablet Take 1 tablet (600 mg total) by mouth every 6 (six) hours. 03/23/20   McVey, Murray Hodgkins, CNM  Prenatal Vit-Fe Fumarate-FA (PRENATAL MULTIVITAMIN) TABS tablet Take 1 tablet by mouth daily at 12 noon. 06/16/16   Schermerhorn, [name], MD  senna-docusate (SENOKOT-S) 8.6-50 MG tablet Take 2 tablets by mouth daily. 03/24/20   McVey, Murray Hodgkins, CNM  witch hazel-glycerin (TUCKS) pad Apply 1 application topically as needed for hemorrhoids. 03/23/20   McVey, Murray Hodgkins, CNM    Allergies Patient has no known allergies.  History reviewed. No pertinent family history.  Social History Social History   Tobacco Use  . Smoking status: Never Smoker  . Smokeless  tobacco: Never Used  Vaping Use  . Vaping Use: Never used  Substance Use Topics  . Alcohol use: No  . Drug use: No    Review of Systems  Constitutional: No fever/chills Eyes: No visual changes. ENT: No sore throat. Cardiovascular: Denies chest pain. Respiratory: Denies shortness of breath. Gastrointestinal: No abdominal pain.  No nausea, no vomiting.  No diarrhea.  No constipation. Genitourinary: Negative for dysuria.  Positive for painless vaginal bleeding. Musculoskeletal: Negative for back pain. Skin: Negative for rash. Neurological: Negative for headaches, focal weakness or numbness.  ____________________________________________   PHYSICAL EXAM:  VITAL SIGNS: Vitals:   06/09/20 0939  BP: 113/67  Pulse: (!) 59  Resp: 16  Temp: 98.4 F (36.9 C)  SpO2: 97%     Constitutional: Alert and oriented. Well appearing and in no acute distress. Eyes: Conjunctivae are normal. PERRL. EOMI. Head: Atraumatic. Nose: No congestion/rhinnorhea. Mouth/Throat: Mucous membranes are moist.  Oropharynx non-erythematous. Neck: No stridor. No cervical spine tenderness to palpation. Cardiovascular: Normal rate, regular rhythm. Grossly normal heart sounds.  Good peripheral circulation. Respiratory: Normal respiratory effort.  No retractions. Lungs CTAB. Gastrointestinal: Soft , nondistended, nontender to palpation. No CVA tenderness. Musculoskeletal: No lower extremity tenderness nor edema.  No joint effusions. No signs of acute trauma. Neurologic:  Normal speech and language. No gross focal neurologic deficits are appreciated. No gait instability noted. Skin:  Skin is warm, dry and intact. No rash noted. Psychiatric: Mood and affect are normal. Speech and behavior are normal.  ____________________________________________   LABS (all labs ordered are listed, but only abnormal results are displayed)  Labs Reviewed  CBC WITH DIFFERENTIAL/PLATELET - Abnormal; Notable for the following  components:      Result Value   RBC 5.23 (*)    MCV 77.4 (*)    MCH 25.8 (*)    RDW 18.2 (*)    All other components within normal limits  COMPREHENSIVE METABOLIC PANEL  POC URINE PREG, ED   ____________________________________________  12 Lead EKG   ____________________________________________  RADIOLOGY  ED MD interpretation:    Official radiology report(s): No results found.  ____________________________________________   PROCEDURES and INTERVENTIONS  Procedure(s) performed (including Critical Care):  Procedures  Medications - No data to display  ____________________________________________   MDM / ED COURSE   25 year old woman who is about 2 months postpartum presents to the ED with vaginal bleeding, without evidence of acute derangements, and amenable to follow-up with OB/GYN.  Normal vitals on room air.  Exam is reassuring without evidence of any acute derangements.  She looks well with a benign abdomen.  She has had no pain throughout all of this and just painless bleeding.  Hemoglobin is stable and CBC and metabolic panel is reassuring.  We discussed the possibility of retained products of conception's or other peripartum/postpartum complications, but patient prefers to follow-up with her OB/GYN for pelvic imaging.  Considering how well she looks, I think is reasonable.  We discussed NSAIDs and we discussed return precautions for the ED.  Patient stable for outpatient management   Clinical Course as  of 06/09/20 1603  Sun Jun 09, 2020  1100 Shared decision making at the bedside.  We discussed utility of ultrasound imaging of her pelvis to assess for retained products of conception or other perinatal complication that could be causing her bleeding.  We discussed benign clinical picture, reassuring blood counts.  We discussed the alternative possibility of empiric NSAIDs and following up closely with OB/GYN as an outpatient.  Patient prefers this latter option and  does not want imaging today.  We discussed return precautions for the ED and I answered questions. [DS]    Clinical Course User Index [DS] Vladimir Crofts, MD    ____________________________________________   FINAL CLINICAL IMPRESSION(S) / ED DIAGNOSES  Final diagnoses:  Vaginal bleeding  Abnormal uterine bleeding     ED Discharge Orders    None       Kamaiyah Uselton Tamala Julian   Note:  This document was prepared using Dragon voice recognition software and may include unintentional dictation errors.   Vladimir Crofts, MD 06/09/20 [phone]

## 2020-06-09 NOTE — ED Triage Notes (Signed)
Patient c/o vaginal bleeding that is heavy and clots present. Recently had baby X2 months ago. Denies pain. C/o headaches. Reports she just got off of period a few weeks ago

## 2020-06-09 NOTE — Discharge Instructions (Signed)
As we discussed, please use naproxen/Aleve.  Take 500 mg up to every 12 hours.  This should help slow your bleeding  Follow-up with OB/GYN.  If you develop any further worsening bleeding, passing out, abdominal pain, fevers, please return to the ED.

## 2020-06-11 ENCOUNTER — Other Ambulatory Visit: Payer: Self-pay

## 2020-06-11 ENCOUNTER — Emergency Department
Admission: EM | Admit: 2020-06-11 | Discharge: 2020-06-11 | Disposition: A | Discharge status: Home / Self Care | Payer: Medicaid Other | Attending: Emergency Medicine | Admitting: Emergency Medicine

## 2020-06-11 ENCOUNTER — Emergency Department: Payer: Medicaid Other

## 2020-06-11 DIAGNOSIS — R102 Pelvic and perineal pain: Secondary | ICD-10-CM

## 2020-06-11 DIAGNOSIS — N939 Abnormal uterine and vaginal bleeding, unspecified: Secondary | ICD-10-CM | POA: Diagnosis not present

## 2020-06-11 DIAGNOSIS — R519 Headache, unspecified: Secondary | ICD-10-CM | POA: Diagnosis not present

## 2020-06-11 LAB — BASIC METABOLIC PANEL
Anion gap: 6 (ref 5–15)
BUN: 15 mg/dL (ref 6–20)
CO2: 23 mmol/L (ref 22–32)
Calcium: 8.8 mg/dL — ABNORMAL LOW (ref 8.9–10.3)
Chloride: 106 mmol/L (ref 98–111)
Creatinine, Ser: 0.56 mg/dL (ref 0.44–1.00)
GFR, Estimated: >60 mL/min (ref >60)
Glucose, Bld: 107 mg/dL — ABNORMAL HIGH (ref 70–99)
Potassium: 3.9 mmol/L (ref 3.5–5.1)
Sodium: 135 mmol/L (ref 135–145)

## 2020-06-11 LAB — HEPATIC FUNCTION PANEL
ALT: 26 U/L (ref 0–44)
AST: 20 U/L (ref 15–41)
Albumin: 3.9 g/dL (ref 3.5–5.0)
Alkaline Phosphatase: 58 U/L (ref 38–126)
Bilirubin, Direct: <0.1 mg/dL (ref 0.0–0.2)
Total Bilirubin: 0.6 mg/dL (ref 0.3–1.2)
Total Protein: 6.8 g/dL (ref 6.5–8.1)

## 2020-06-11 LAB — HCG, QUANTITATIVE, PREGNANCY: hCG, Beta Chain, Quant, S: <1 m[IU]/mL (ref <5)

## 2020-06-11 LAB — CBC
HCT: 37.1 % (ref 36.0–46.0)
Hemoglobin: 12.1 g/dL (ref 12.0–15.0)
MCH: 25.5 pg — ABNORMAL LOW (ref 26.0–34.0)
MCHC: 32.6 g/dL (ref 30.0–36.0)
MCV: 78.3 fL — ABNORMAL LOW (ref 80.0–100.0)
Platelets: 211 10*3/uL (ref 150–400)
RBC: 4.74 MIL/uL (ref 3.87–5.11)
RDW: 17.7 % — ABNORMAL HIGH (ref 11.5–15.5)
WBC: 5.8 10*3/uL (ref 4.0–10.5)
nRBC: 0 % (ref 0.0–0.2)

## 2020-06-11 LAB — WET PREP, GENITAL
Clue Cells Wet Prep HPF POC: NONE SEEN
Sperm: NONE SEEN
Trich, Wet Prep: NONE SEEN
Yeast Wet Prep HPF POC: NONE SEEN

## 2020-06-11 LAB — CHLAMYDIA/NGC RT PCR (ARMC ONLY)
Chlamydia Tr: NOT DETECTED
N gonorrhoeae: NOT DETECTED

## 2020-06-11 LAB — TSH: TSH: 1.283 u[IU]/mL (ref 0.350–4.500)

## 2020-06-11 NOTE — ED Triage Notes (Signed)
First nurse Note:  C/O vaginal bleeding.  Seen through ED Sunday for same, states symptoms persist.  AAOx3.  Skin warm and dry. NAD

## 2020-06-11 NOTE — ED Notes (Signed)
Pt has been provided with discharge instructions. Pt denies any questions or concerns at this time. Pt verbalizes understanding for follow up care and d/c.  VSS.  Pt left department with all belongings.  

## 2020-06-11 NOTE — ED Provider Notes (Addendum)
[facility] Emergency Department Provider Note  ____________________________________________   Event Date/Time   First MD Initiated Contact with Patient 06/11/20 1300     (approximate)  I have reviewed the triage vital signs and the nursing notes.   HISTORY  Chief Complaint Vaginal Bleeding   HPI Angela Levy is a 25 y.o. female with a past medical history of anxiety, migraines, POTS, scoliosis, sciatica and anemia who presents for assessment of approximately 5 days of some persistent vaginal bleeding with passage of blood clots.  Patient states she was seen in the ED 2 days ago and started ibuprofen and subsequently called her OB who started her on TXA but her bleeding has not significantly slowed.  She is not sure how many pads she has gone through but has almost gone through 3 boxes of pads in the last 5 days.  She notes she delivered her son via vaginal delivery in February and that was uncomplicated and that for the onset of bleeding 5 days ago she had bleeding free for several days although had some spotting before that.  She states he sometimes has a little bit of epigastric discomfort on and off for the last couple days but no lower abdominal pain pelvic pain chest pain, cough, shortness of breath, back pain, rash or extremity pain.  She endorses headache.  No recent falls or injuries.  She is on any blood thinners.  He states he has been taking all medications as directed.  No prior similar episodes.         Past Medical History:  Diagnosis Date  . Anemia during pregnancy   . Anxiety   . Migraines   . POTS (postural orthostatic tachycardia syndrome)   . Sciatica of right side   . Scoliosis     Patient Active Problem List   Diagnosis Date Noted  . Labor and delivery, indication for care 03/21/2020  . Preterm uterine contractions in third trimester, antepartum 03/07/2020  . Threatened preterm labor, third trimester 02/22/2020  . Uterine size  date discrepancy, third trimester 02/22/2020  . Preterm contractions 01/17/2020  . Encounter for supervision of normal pregnancy in third trimester 09/20/2019  . IUGR (intrauterine growth restriction) affecting care of mother 06/13/2016  . Rh negative status during pregnancy 12/02/2015  . Adolescent idiopathic scoliosis of lumbar region 10/09/2014  . Migraine without aura and without status migrainosus, not intractable 10/09/2014  . Hx of esophagitis 06/08/2014  . Hx of tinea corporis 06/08/2014    Past Surgical History:  Procedure Laterality Date  . NO PAST SURGERIES      Prior to Admission medications   Medication Sig Start Date End Date Taking? Authorizing Provider  acetaminophen (TYLENOL) 325 MG tablet Take 2 tablets (650 mg total) by mouth every 4 (four) hours as needed (for pain scale < 4). 03/23/20   McVey, Murray Hodgkins, CNM  benzocaine-Menthol (DERMOPLAST) 20-0.5 % AERO Apply 1 application topically as needed for irritation (perineal discomfort). 03/23/20   McVey, Murray Hodgkins, CNM  calcium carbonate (TUMS - DOSED IN MG ELEMENTAL CALCIUM) 500 MG chewable tablet Chew 2 tablets (400 mg of elemental calcium total) by mouth every 4 (four) hours as needed for indigestion. Patient not taking: No sig reported 02/23/20   McVey, Wells Guiles A, CNM  coconut oil OIL Apply 1 application topically as needed. 03/23/20   McVey, Murray Hodgkins, CNM  famotidine (PEPCID) 10 MG tablet Take 10 mg by mouth 2 (two) times daily.    [provider]  ibuprofen (ADVIL) 600 MG tablet Take 1 tablet (600 mg total) by mouth every 6 (six) hours. 03/23/20   McVey, Murray Hodgkins, CNM  Prenatal Vit-Fe Fumarate-FA (PRENATAL MULTIVITAMIN) TABS tablet Take 1 tablet by mouth daily at 12 noon. 06/16/16   Schermerhorn, [name], MD  senna-docusate (SENOKOT-S) 8.6-50 MG tablet Take 2 tablets by mouth daily. 03/24/20   McVey, Murray Hodgkins, CNM  witch hazel-glycerin (TUCKS) pad Apply 1 application topically as needed for hemorrhoids. 03/23/20   McVey,  Murray Hodgkins, CNM    Allergies Patient has no known allergies.  No family history on file.  Social History Social History   Tobacco Use  . Smoking status: Never Smoker  . Smokeless tobacco: Never Used  Vaping Use  . Vaping Use: Never used  Substance Use Topics  . Alcohol use: No  . Drug use: No    Review of Systems  Review of Systems  Constitutional: Negative for chills and fever.  HENT: Negative for sore throat.   Eyes: Negative for pain.  Respiratory: Negative for cough and stridor.   Cardiovascular: Negative for chest pain.  Gastrointestinal: Positive for abdominal pain. Negative for vomiting.  Genitourinary: Negative for dysuria.  Musculoskeletal: Negative for myalgias.  Skin: Negative for rash.  Neurological: Positive for headaches. Negative for seizures and loss of consciousness.  Psychiatric/Behavioral: Negative for suicidal ideas.  All other systems reviewed and are negative.     ____________________________________________   PHYSICAL EXAM:  VITAL SIGNS: ED Triage Vitals  Enc Vitals Group     BP 06/11/20 1237 104/62     Pulse Rate 06/11/20 1237 97     Resp 06/11/20 1237 16     Temp 06/11/20 1237 97.9 F (36.6 C)     Temp Source 06/11/20 1237 Oral     SpO2 06/11/20 1237 98 %     Weight 06/11/20 1238 157 lb (71.2 kg)     Height 06/11/20 1238 5\' 7"  (1.702 m)     Head Circumference --      Peak Flow --      Pain Score 06/11/20 1238 3     Pain Loc --      Pain Edu? --      Excl. in [place]? --    Vitals:   06/11/20 1237  BP: 104/62  Pulse: 97  Resp: 16  Temp: 97.9 F (36.6 C)  SpO2: 98%   Physical Exam Vitals and nursing note reviewed. Exam conducted with a chaperone present.  Constitutional:      General: She is not in acute distress.    Appearance: She is well-developed.  HENT:     Head: Normocephalic and atraumatic.     Right Ear: External ear normal.     Left Ear: External ear normal.     Nose: Nose normal.  Eyes:      Conjunctiva/sclera: Conjunctivae normal.  Cardiovascular:     Rate and Rhythm: Normal rate and regular rhythm.     Heart sounds: No murmur heard.   Pulmonary:     Effort: Pulmonary effort is normal. No respiratory distress.     Breath sounds: Normal breath sounds.  Abdominal:     Palpations: Abdomen is soft.     Tenderness: There is no abdominal tenderness.  Genitourinary:    Cervix: Normal.     Comments: Slow oozing of blood noted through os. Musculoskeletal:     Cervical back: Neck supple.  Skin:    General: Skin is warm and dry.  Neurological:     Mental Status: She is alert and oriented to person, place, and time.  Psychiatric:        Mood and Affect: Mood normal.      ____________________________________________   LABS (all labs ordered are listed, but only abnormal results are displayed)  Labs Reviewed  BASIC METABOLIC PANEL - Abnormal; Notable for the following components:      Result Value   Glucose, Bld 107 (*)    Calcium 8.8 (*)    All other components within normal limits  CBC - Abnormal; Notable for the following components:   MCV 78.3 (*)    MCH 25.5 (*)    RDW 17.7 (*)    All other components within normal limits  WET PREP, GENITAL  CHLAMYDIA/NGC RT PCR ([facility] ONLY)  HCG, QUANTITATIVE, PREGNANCY  TSH  HEPATIC FUNCTION PANEL   ____________________________________________  EKG  ____________________________________________  RADIOLOGY  ED MD interpretation: Pelvic ultrasound shows no evidence of torsion or ovarian cyst, fibroids, retained tissue in the endometrium or other acute pelvic abnormality.  Official radiology report(s): US PELVIS (TRANSABDOMINAL ONLY)  Result Date: 06/11/2020 CLINICAL DATA:  Central pelvic pain and vaginal bleeding for the past 5 days. EXAM: TRANSABDOMINAL ULTRASOUND OF PELVIS DOPPLER ULTRASOUND OF OVARIES TECHNIQUE: Both transabdominal and transvaginal ultrasound examinations of the pelvis were performed.  Transabdominal technique was performed for global imaging of the pelvis including uterus, ovaries, adnexal regions, and pelvic cul-de-sac. Color and duplex Doppler ultrasound was utilized to evaluate blood flow to the ovaries. COMPARISON:  MRI abdomen and pelvis dated August 20, 2017. FINDINGS: Uterus Measurements: 8.1 x 3.6 x 6.5 cm = volume: 100 mL. No fibroids or other mass visualized. Endometrium Thickness: 8 mm.  No focal abnormality visualized. Right ovary Measurements: 3.4 x 1.7 x 3.9 cm = volume: 11.5 mL. Normal appearance/no adnexal mass. Left ovary Measurements: 3.2 x 2.3 x 3.0 cm = volume: 11.6 mL. Normal appearance/no adnexal mass. Pulsed Doppler evaluation of both ovaries demonstrates normal low-resistance arterial and venous waveforms. Other findings No abnormal free fluid. IMPRESSION: 1. Normal pelvic ultrasound. Electronically Signed   By: Titus Dubin M.D.   On: 06/11/2020 15:48   US PELVIC DOPPLER (TORSION R/O OR MASS ARTERIAL FLOW)  Result Date: 06/11/2020 CLINICAL DATA:  Central pelvic pain and vaginal bleeding for the past 5 days. EXAM: TRANSABDOMINAL ULTRASOUND OF PELVIS DOPPLER ULTRASOUND OF OVARIES TECHNIQUE: Both transabdominal and transvaginal ultrasound examinations of the pelvis were performed. Transabdominal technique was performed for global imaging of the pelvis including uterus, ovaries, adnexal regions, and pelvic cul-de-sac. Color and duplex Doppler ultrasound was utilized to evaluate blood flow to the ovaries. COMPARISON:  MRI abdomen and pelvis dated August 20, 2017. FINDINGS: Uterus Measurements: 8.1 x 3.6 x 6.5 cm = volume: 100 mL. No fibroids or other mass visualized. Endometrium Thickness: 8 mm.  No focal abnormality visualized. Right ovary Measurements: 3.4 x 1.7 x 3.9 cm = volume: 11.5 mL. Normal appearance/no adnexal mass. Left ovary Measurements: 3.2 x 2.3 x 3.0 cm = volume: 11.6 mL. Normal appearance/no adnexal mass. Pulsed Doppler evaluation of both ovaries demonstrates  normal low-resistance arterial and venous waveforms. Other findings No abnormal free fluid. IMPRESSION: 1. Normal pelvic ultrasound. Electronically Signed   By: Titus Dubin M.D.   On: 06/11/2020 15:48    ____________________________________________   PROCEDURES  Procedure(s) performed (including Critical Care):  Procedures   ____________________________________________   INITIAL IMPRESSION / ASSESSMENT AND PLAN / ED COURSE  Patient presents with above stage III exam for approximately 5 days of some persistent bleeding not associated with any lower abdominal pelvic pain.  She was started on NSAIDs couple days ago and has had little bit epigastric discomfort likely gastritis from NSAIDs without any other clear associated symptoms.  On arrival she is afebrile and hemodynamically stable.  No tenderness in the right upper quadrant or findings on CMP to suggest cholecystitis cholelithiasis pancreatitis appendicitis or diverticulitis.  Advised patient to discontinue NSAIDs.  Overall unclear allergy for patient's vaginal bleeding.  Ultrasound shows no evidence of fibroids retained tissue or other abnormality of the pelvis or ovaries.  No findings on exam to suggest acute infectious etiology.  Patient has no known coagulopathy and is not significantly anemic or thrombocytopenic.  Hepatic function panel is unremarkable.  BMP shows no significant Metabolic derangements.  TSH is WNL.  Discussed patient's presentation work-up with on-call nurse midwife who spoke with patient will start patient on OCPs and have patient follow-up in clinic.  Given stable vitals otherwise reassuring work-up I think this is reasonable.  Blood prep and GC sent from pelvic exam although very low suspicion for infection at this time.  Patient discharged stable condition.  Strict return cautions advised and discussed.        ____________________________________________   FINAL CLINICAL IMPRESSION(S) / ED  DIAGNOSES  Final diagnoses:  Vaginal bleeding    Medications - No data to display   ED Discharge Orders    None       Note:  This document was prepared using Dragon voice recognition software and may include unintentional dictation errors.   Lucrezia Starch, MD 06/11/20 1641    Lucrezia Starch, MD 06/11/20 [phone]

## 2020-06-11 NOTE — ED Triage Notes (Signed)
Pt c/o heavy vaginal bleeding for the past 5 days, was seen here on Sunday for the same, called her OB and cant be seen til tomorrow, was given some medication to help with the bleeding yesterday but not helping

## 2020-06-11 NOTE — ED Notes (Signed)
Pt consented to receiving discharge instructions. Signature pad not working.

## 2020-06-11 NOTE — ED Notes (Signed)
Chaperoned provider for pelvic exam.  

## 2020-12-02 ENCOUNTER — Encounter: Payer: Medicaid Other | Admitting: Obstetrics and Gynecology

## 2020-12-23 ENCOUNTER — Encounter: Payer: Medicaid Other | Admitting: Obstetrics and Gynecology

## 2020-12-26 ENCOUNTER — Encounter: Payer: Medicaid Other | Admitting: Obstetrics and Gynecology

## 2022-02-23 NOTE — H&P (Signed)
Preoperative History and Physical   Chief Complaint:  Angela Levy is a 27 y.o. [phone] here for surgical management of menorrhagia with irregular cycle.   No significant preoperative concerns.   History of Present Illness: 27 y.o. [id number] female who presents with concerns for ongoing bleeding.  Since the birth of her child in March 2022, after period of time with no bleeding, she has had nearly uncontrolled bleeding the entire time.  In that time she has tried Depo-Provera (for about 6 months), she has tried oral Provera, she has tried [place], Health visitor, [place], Sprintec, norethindrone, and now Megace.  She has even tried a course of doxycycline for 2 weeks for suspected endometritis.  None of the above has been successful.  She occasionally has had a break from her bleeding.  On the medications would work for 1 to 1-1/2 months and the bleeding would resume.  She had normal pelvic ultrasound in April 2023.   STI screen: 12/25/2020: normal. Wet prep: 12/25/2020: normal TSH 10/2020: normal 10/2020: Free and total testosterone - normal 10/2020: Prolactin - normal 10/2020: hemoglobin A1c - normal 10/2020: [place] and LH - normal 10/2020: 17 OHP - normal 07/2020: vWD testing panel - normal   Notes from Hassan Buckler, CNM: Onset: April 2022 Location: vagina/uterus Duration: comes and goes Characteristics:  - vaginal bleeding off and on since mid April.  - reports bleeding every day, since Depo in September. Bleeding is light to moderate, but has small gushes every time she changes position.  Aggravating Factors: none Relieving Factors: None Treatments:  Seen for PP exam 05/20/20, started menses a few days later that lasted about 1 week. Then intermittently spotting for about 2 weeks. Heavy bleeding started on 4/24- she was seen at [facility] ER on 4/24 and 4/26 for heavy bleeding with clots. - improved bleeding with provera 2 tabs BID, once the taper started down, her bleeding increased again. She completed last day of  provera taper on 5/19 and then started bleeding moderate to heavy again.  - Negative pelvic US done at [facility] 06/11/20             - 06/10/20: started Lysteda TID without improvement of bleeding. Started Provera Taper after 4/26 visit to ER.              - Started OCPs -sprintec- around 07/08/20 Pt messaged electronically that as of 6/11: she was not bleeding.  - reports spotting started on 07/25/20, and heavier bleeding started on 6/13, her placebo pills started on 6/12, and restarted a new pack on 6/19.  - reports bleeding has continued since 6/13 and has continued moderate to heavy daily, she is using 6-8 pads per day.  - Depo on 10/2020, reports continued daily bleeding since Depo. No pain. Very tired of bleeding.  - s/p negative VWD workup, Labs in early Sept- no anemia, no e/o PCOS - 12/2020: trialed OCPs: Yaz, pt reports frequent bleeding still and only 1-2weeks each month with no bleeding during Jan, Feb, and March. Some days are very heavy and soaks through overnight pads, others are light flow.  Severity: n/a     Menses: moderate bleeding with severe cramping prior to birth of her children.    Last pap smear: 09/20/19 NILM History of abnormal pap smears: no Sexually active: occasional Current contraception: Depo Previous contraception use: OCPs- no issues, patch- caused severe nausea, POPs- constant bleeding; Depo- her sister had constant bleeding with it, but mother has amenorrhea with it.  History of sexually transmitted  infections: denies     Proposed surgery: robot assisted total laparoscopic hysterectomy, bilateral salpingectomy, cystoscoyp       Past Medical History:  Diagnosis Date   Adolescent idiopathic scoliosis of lumbar region 10/09/2014   Anemia     Anxiety     Migraine without aura and without status migrainosus, not intractable 10/09/2014         Past Surgical History:  Procedure Laterality Date   No Past Surgeries                         OB History  Gravida Para  Term Preterm AB Living  '3 3 3     3  '$ SAB IAB Ectopic Molar Multiple Live Births             3     # Outcome Date GA Lbr Len/2nd Weight Sex Delivery Anes PTL Lv  3 Term 03/21/20 [redacted]w[redacted]d/ 00:16 2.83 kg (6 lb 3.8 oz) M Vag-Spont EPI   LIV  2 Term 01/03/18 [id number] 00:17 3.25 kg (7 lb 2.6 oz) M Vag-Spont EPI   LIV  1 Term 06/14/16 [id number] / 00:39 2.24 kg (4 lb 15 oz) F Vag-Spont EPI   LIV     Birth Comments: IOL due to IUGR  Patient denies any other pertinent gynecologic issues.          Current Outpatient Medications on File Prior to Visit  Medication Sig Dispense Refill   megestroL (MEGACE) 20 MG tablet Take 1 tablet (20 mg total) by mouth once daily 30 tablet 0   norethindrone (AYGESTIN) 5 mg tablet Take 3 tablets (15 mg total) by mouth once daily 90 tablet 11   hydrOXYzine (ATARAX) 25 MG tablet Take 1 tablet (25 mg total) by mouth 3 (three) times daily as needed for Anxiety (Patient not taking: Reported on 01/02/2022) 30 tablet 1   tiZANidine (ZANAFLEX) 2 MG tablet Take 2 mg by mouth at bedtime as needed (Patient not taking: Reported on 01/02/2022)        No current facility-administered medications on file prior to visit.    No Known Allergies   Social History:   reports that she has never smoked. She has never used smokeless tobacco. She reports that she does not drink alcohol and does not use drugs.        Family History  Problem Relation Age of Onset   No Known Problems Mother     Diabetes Maternal Grandmother     Cirrhosis Maternal Grandmother     No Known Problems Sister     No Known Problems Brother     Dementia Maternal Grandfather        Review of Systems: Noncontributory   PHYSICAL EXAM: Blood pressure 129/83, pulse 93, weight 74.5 kg (164 lb 3.9 oz), not currently breastfeeding. CONSTITUTIONAL: Well-developed, well-nourished female in no acute distress.  HENT:  Normocephalic, atraumatic, External right and left ear normal. Oropharynx is clear and moist EYES:  Conjunctivae and EOM are normal. Pupils are equal, round, and reactive to light. No scleral icterus.  NECK: Normal range of motion, supple, no masses SKIN: Skin is warm and dry. No rash noted. Not diaphoretic. No erythema. No pallor. [place] and oriented to person, place, and time. Normal reflexes, muscle tone coordination. No cranial nerve deficit noted. PSYCHIATRIC: Normal mood and affect. Normal behavior. Normal judgment and thought content. CARDIOVASCULAR: Normal heart rate noted, regular rhythm RESPIRATORY: Effort and breath  sounds normal, no problems with respiration noted ABDOMEN: Soft, nontender, nondistended. PELVIC: Deferred MUSCULOSKELETAL: Normal range of motion. No edema and no tenderness. 2+ distal pulses.   Labs: Recent Results  No results found for this or any previous visit (from the past 336 hour(s)).     Imaging Studies: No results found.   Assessment: 1. Menorrhagia with irregular cycle   2. Cervical cancer screening   3. Routine screening for STI (sexually transmitted infection)       Plan: Patient will undergo surgical management with the above noted surgery.   The risks of surgery were discussed in detail with the patient including but not limited to: bleeding which may require transfusion or reoperation; infection which may require antibiotics; injury to surrounding organs which may involve bowel, bladder, ureters ; need for additional procedures including laparoscopy or laparotomy; thromboembolic phenomenon, surgical site problems and other postoperative/anesthesia complications. Likelihood of success in alleviating the patient's condition was discussed. Routine postoperative instructions will be reviewed with the patient and her family in detail after surgery.  The patient concurred with the proposed plan, giving informed written consent for the surgery.   Preoperative prophylactic antibiotics, as indicated, and SCDs ordered on call to the OR.        Attestation Statement:    I personally performed the service. (TP)   [place], MD  [place] 02/23/2022 4:11 PM

## 2022-03-11 ENCOUNTER — Other Ambulatory Visit: Payer: Self-pay

## 2022-03-11 ENCOUNTER — Encounter
Admission: RE | Admit: 2022-03-11 | Discharge: 2022-03-11 | Disposition: A | Discharge status: Home / Self Care | Payer: Medicaid Other | Source: Ambulatory Visit | Attending: Obstetrics and Gynecology | Admitting: Obstetrics and Gynecology

## 2022-03-11 DIAGNOSIS — Z01812 Encounter for preprocedural laboratory examination: Secondary | ICD-10-CM

## 2022-03-11 NOTE — Patient Instructions (Addendum)
Your procedure is scheduled on: 03/20/22 - Friday Report to the Registration Desk on the 1st floor of the [facility]. To find out your arrival time, please call [phone] between 1PM - 3PM on: 03/19/22 - Thursday If your arrival time is 6:00 am, do not arrive prior to that time as the [place] [facility] entrance doors do not open until 6:00 am.  REMEMBER: Instructions that are not followed completely may result in serious medical risk, up to and including death; or upon the discretion of your surgeon and anesthesiologist your surgery may need to be rescheduled.  Do not eat food or drink any fluids after midnight the night before surgery.  No gum chewing, lozengers or hard candies.  TAKE THESE MEDICATIONS THE MORNING OF SURGERY WITH A SIP OF WATER: NONE   One week prior to surgery: Stop Anti-inflammatories (NSAIDS) such as Advil, Aleve, Ibuprofen, Motrin, Naproxen, Naprosyn and Aspirin based products such as Excedrin, Goodys Powder, BC Powder.  Stop ANY OVER THE COUNTER supplements until after surgery.  You may take Tylenol if needed for pain up until the day of surgery.  No Alcohol for 24 hours before or after surgery.  No Smoking including e-cigarettes for 24 hours prior to surgery.  No chewable tobacco products for at least 6 hours prior to surgery.  No nicotine patches on the day of surgery.  Do not use any "recreational" drugs for at least a week prior to your surgery.  Please be advised that the combination of cocaine and anesthesia may have negative outcomes, up to and including death. If you test positive for cocaine, your surgery will be cancelled.  On the morning of surgery brush your teeth with toothpaste and water, you may rinse your mouth with mouthwash if you wish. Do not swallow any toothpaste or mouthwash.  Use CHG Soap or wipes as directed on instruction sheet.  Do not wear jewelry, make-up, hairpins, clips or nail polish.  Do not wear lotions, powders, or  perfumes.   Do not shave body from the neck down 48 hours prior to surgery just in case you cut yourself which could leave a site for infection. Also, freshly shaved skin may become irritated if using the CHG soap.  Contact lenses, hearing aids and dentures may not be worn into surgery.  Do not bring valuables to the hospital. [facility] is not responsible for any missing/lost belongings or valuables.   Notify your doctor if there is any change in your medical condition (cold, fever, infection).  Wear comfortable clothing (specific to your surgery type) to the hospital.  After surgery, you can help prevent lung complications by doing breathing exercises.  Take deep breaths and cough every 1-2 hours. Your doctor may order a device called an Incentive Spirometer to help you take deep breaths. When coughing or sneezing, hold a pillow firmly against your incision with both hands. This is called "splinting." Doing this helps protect your incision. It also decreases belly discomfort.  If you are being admitted to the hospital overnight, leave your suitcase in the car. After surgery it may be brought to your room.  If you are being discharged the day of surgery, you will not be allowed to drive home. You will need a responsible adult (18 years or older) to drive you home and stay with you that night.   If you are taking public transportation, you will need to have a responsible adult (18 years or older) with you. Please confirm with your physician  that it is acceptable to use public transportation.   Please call the [facility] at [phone] if you have any questions about these instructions.  Surgery Visitation Policy:  Patients undergoing a surgery or procedure may have two family members or support persons with them as long as the person is not COVID-19 positive or experiencing its symptoms.   Inpatient Visitation:    Visiting hours are 7 a.m. to 8 p.m. Up to four  visitors are allowed at one time in a patient room. The visitors may rotate out with other people during the day. One designated support person (adult) may remain overnight.  Due to an increase in RSV and influenza rates and associated hospitalizations, children ages 47 and under will not be able to visit patients in [facility]. Masks continue to be strongly recommended.

## 2022-03-12 ENCOUNTER — Encounter
Admission: RE | Admit: 2022-03-12 | Discharge: 2022-03-12 | Disposition: A | Discharge status: Home / Self Care | Payer: Medicaid Other | Source: Ambulatory Visit | Attending: Obstetrics and Gynecology | Admitting: Obstetrics and Gynecology

## 2022-03-12 DIAGNOSIS — Z01812 Encounter for preprocedural laboratory examination: Secondary | ICD-10-CM | POA: Diagnosis not present

## 2022-03-12 DIAGNOSIS — N921 Excessive and frequent menstruation with irregular cycle: Secondary | ICD-10-CM | POA: Insufficient documentation

## 2022-03-12 LAB — CBC
HCT: 38.5 % (ref 36.0–46.0)
Hemoglobin: 12.3 g/dL (ref 12.0–15.0)
MCH: 24.9 pg — ABNORMAL LOW (ref 26.0–34.0)
MCHC: 31.9 g/dL (ref 30.0–36.0)
MCV: 78.1 fL — ABNORMAL LOW (ref 80.0–100.0)
Platelets: 244 10*3/uL (ref 150–400)
RBC: 4.93 MIL/uL (ref 3.87–5.11)
RDW: 14.9 % (ref 11.5–15.5)
WBC: 8.6 10*3/uL (ref 4.0–10.5)
nRBC: 0 % (ref 0.0–0.2)

## 2022-03-12 LAB — TYPE AND SCREEN
ABO/RH(D): A NEG
Antibody Screen: NEGATIVE

## 2022-03-20 ENCOUNTER — Encounter
Admission: RE | Disposition: A | Discharge status: Home / Self Care | Payer: Self-pay | Source: Home / Self Care | Attending: Obstetrics and Gynecology

## 2022-03-20 ENCOUNTER — Encounter: Payer: Self-pay | Admitting: Obstetrics and Gynecology

## 2022-03-20 ENCOUNTER — Other Ambulatory Visit: Payer: Self-pay

## 2022-03-20 ENCOUNTER — Ambulatory Visit: Payer: Medicaid Other | Admitting: Urgent Care

## 2022-03-20 ENCOUNTER — Ambulatory Visit
Admission: RE | Admit: 2022-03-20 | Discharge: 2022-03-20 | Disposition: A | Discharge status: Home / Self Care | Payer: Medicaid Other | Attending: Obstetrics and Gynecology | Admitting: Obstetrics and Gynecology

## 2022-03-20 DIAGNOSIS — Z01812 Encounter for preprocedural laboratory examination: Secondary | ICD-10-CM

## 2022-03-20 DIAGNOSIS — D259 Leiomyoma of uterus, unspecified: Secondary | ICD-10-CM | POA: Diagnosis not present

## 2022-03-20 DIAGNOSIS — N921 Excessive and frequent menstruation with irregular cycle: Secondary | ICD-10-CM | POA: Diagnosis present

## 2022-03-20 HISTORY — PX: ROBOTIC ASSISTED LAPAROSCOPIC HYSTERECTOMY AND SALPINGECTOMY: SHX6379

## 2022-03-20 HISTORY — PX: CYSTOSCOPY: SHX5120

## 2022-03-20 LAB — POCT PREGNANCY, URINE: Preg Test, Ur: NEGATIVE

## 2022-03-20 SURGERY — XI ROBOTIC ASSISTED LAPAROSCOPIC HYSTERECTOMY AND SALPINGECTOMY
Anesthesia: General | Site: Abdomen

## 2022-03-20 MED ORDER — FENTANYL CITRATE (PF) 100 MCG/2ML IJ SOLN
INTRAMUSCULAR | Status: AC
  Filled 2022-03-20: qty 2

## 2022-03-20 MED ORDER — ROCURONIUM BROMIDE 100 MG/10ML IV SOLN
INTRAVENOUS | Status: DC | PRN
  Administered 2022-03-20: 50 mg via INTRAVENOUS
  Administered 2022-03-20 (×2): 30 mg via INTRAVENOUS

## 2022-03-20 MED ORDER — MIDAZOLAM HCL 2 MG/2ML IJ SOLN
INTRAMUSCULAR | Status: DC | PRN
  Administered 2022-03-20: 2 mg via INTRAVENOUS

## 2022-03-20 MED ORDER — HYDROMORPHONE HCL 1 MG/ML IJ SOLN
INTRAMUSCULAR | Status: DC | PRN
  Administered 2022-03-20: 1 mg via INTRAVENOUS

## 2022-03-20 MED ORDER — GLYCOPYRROLATE 0.2 MG/ML IJ SOLN
INTRAMUSCULAR | Status: DC | PRN
  Administered 2022-03-20: .2 mg via INTRAVENOUS

## 2022-03-20 MED ORDER — OXYCODONE HCL 5 MG PO TABS
ORAL_TABLET | ORAL | Status: AC
  Filled 2022-03-20: qty 1

## 2022-03-20 MED ORDER — HYDROMORPHONE HCL 1 MG/ML IJ SOLN
INTRAMUSCULAR | Status: AC
  Administered 2022-03-20: 0.5 mg via INTRAVENOUS
  Filled 2022-03-20: qty 1

## 2022-03-20 MED ORDER — OXYCODONE HCL 5 MG/5ML PO SOLN: 5.0000 mg | Freq: Once | ORAL | Status: AC | PRN

## 2022-03-20 MED ORDER — CHLORHEXIDINE GLUCONATE 0.12 % MT SOLN
15.0000 mL | Freq: Once | OROMUCOSAL | Status: AC
  Administered 2022-03-20: 15 mL via OROMUCOSAL

## 2022-03-20 MED ORDER — LIDOCAINE HCL (CARDIAC) PF 100 MG/5ML IV SOSY
PREFILLED_SYRINGE | INTRAVENOUS | Status: DC | PRN
  Administered 2022-03-20: 100 mg via INTRAVENOUS

## 2022-03-20 MED ORDER — FAMOTIDINE 20 MG PO TABS
ORAL_TABLET | ORAL | Status: AC
  Filled 2022-03-20: qty 1

## 2022-03-20 MED ORDER — CEFAZOLIN SODIUM-DEXTROSE 2-4 GM/100ML-% IV SOLN
2.0000 g | INTRAVENOUS | Status: AC
  Administered 2022-03-20: 2 g via INTRAVENOUS

## 2022-03-20 MED ORDER — HYDROMORPHONE HCL 1 MG/ML IJ SOLN
0.2500 mg | INTRAMUSCULAR | Status: DC | PRN
  Administered 2022-03-20: 0.5 mg via INTRAVENOUS

## 2022-03-20 MED ORDER — SUGAMMADEX SODIUM 200 MG/2ML IV SOLN
INTRAVENOUS | Status: DC | PRN
  Administered 2022-03-20: 200 mg via INTRAVENOUS

## 2022-03-20 MED ORDER — DEXMEDETOMIDINE HCL IN NACL 200 MCG/50ML IV SOLN
INTRAVENOUS | Status: DC | PRN
  Administered 2022-03-20 (×2): 8 ug via INTRAVENOUS

## 2022-03-20 MED ORDER — ORAL CARE MOUTH RINSE: 15.0000 mL | Freq: Once | OROMUCOSAL | Status: AC

## 2022-03-20 MED ORDER — PHENYLEPHRINE HCL-NACL 20-0.9 MG/250ML-% IV SOLN
INTRAVENOUS | Status: DC | PRN
  Administered 2022-03-20: 10 ug/min via INTRAVENOUS

## 2022-03-20 MED ORDER — POVIDONE-IODINE 10 % EX SWAB
2.0000 | Freq: Once | CUTANEOUS | Status: AC
  Administered 2022-03-20: 2 via TOPICAL

## 2022-03-20 MED ORDER — 0.9 % SODIUM CHLORIDE (POUR BTL) OPTIME
TOPICAL | Status: DC | PRN
  Administered 2022-03-20: 500 mL

## 2022-03-20 MED ORDER — ACETAMINOPHEN 10 MG/ML IV SOLN
INTRAVENOUS | Status: DC | PRN
  Administered 2022-03-20: 1000 mg via INTRAVENOUS

## 2022-03-20 MED ORDER — PROPOFOL 10 MG/ML IV BOLUS
INTRAVENOUS | Status: DC | PRN
  Administered 2022-03-20: 200 mg via INTRAVENOUS
  Administered 2022-03-20: 100 mg via INTRAVENOUS

## 2022-03-20 MED ORDER — SOD CITRATE-CITRIC ACID 500-334 MG/5ML PO SOLN
30.0000 mL | ORAL | Status: DC
  Filled 2022-03-20: qty 30

## 2022-03-20 MED ORDER — PROPOFOL 1000 MG/100ML IV EMUL
INTRAVENOUS | Status: AC
  Filled 2022-03-20: qty 100

## 2022-03-20 MED ORDER — OXYCODONE-ACETAMINOPHEN 5-325 MG PO TABS: 1.0000 | ORAL_TABLET | Freq: Four times a day (QID) | ORAL | 0 refills | Status: AC | PRN

## 2022-03-20 MED ORDER — ONDANSETRON HCL 4 MG/2ML IJ SOLN
INTRAMUSCULAR | Status: DC | PRN
  Administered 2022-03-20 (×2): 4 mg via INTRAVENOUS

## 2022-03-20 MED ORDER — KETOROLAC TROMETHAMINE 30 MG/ML IJ SOLN
INTRAMUSCULAR | Status: DC | PRN
  Administered 2022-03-20: 30 mg via INTRAVENOUS

## 2022-03-20 MED ORDER — FENTANYL CITRATE (PF) 100 MCG/2ML IJ SOLN
INTRAMUSCULAR | Status: DC | PRN
  Administered 2022-03-20 (×2): 50 ug via INTRAVENOUS

## 2022-03-20 MED ORDER — SODIUM CHLORIDE 0.9 % IV SOLN: INTRAVENOUS | Status: DC

## 2022-03-20 MED ORDER — OXYCODONE HCL 5 MG PO TABS
5.0000 mg | ORAL_TABLET | Freq: Once | ORAL | Status: AC | PRN
  Administered 2022-03-20: 5 mg via ORAL

## 2022-03-20 MED ORDER — HYDROMORPHONE HCL 1 MG/ML IJ SOLN
INTRAMUSCULAR | Status: AC
  Filled 2022-03-20: qty 1

## 2022-03-20 MED ORDER — DROPERIDOL 2.5 MG/ML IJ SOLN
INTRAMUSCULAR | Status: AC
  Filled 2022-03-20: qty 2

## 2022-03-20 MED ORDER — KETAMINE HCL 50 MG/5ML IJ SOSY
PREFILLED_SYRINGE | INTRAMUSCULAR | Status: AC
  Filled 2022-03-20: qty 5

## 2022-03-20 MED ORDER — PHENYLEPHRINE 80 MCG/ML (10ML) SYRINGE FOR IV PUSH (FOR BLOOD PRESSURE SUPPORT)
PREFILLED_SYRINGE | INTRAVENOUS | Status: DC | PRN
  Administered 2022-03-20 (×4): 80 ug via INTRAVENOUS
  Administered 2022-03-20: 40 ug via INTRAVENOUS

## 2022-03-20 MED ORDER — DEXAMETHASONE SODIUM PHOSPHATE 10 MG/ML IJ SOLN
INTRAMUSCULAR | Status: DC | PRN
  Administered 2022-03-20: 10 mg via INTRAVENOUS

## 2022-03-20 MED ORDER — FAMOTIDINE 20 MG PO TABS
20.0000 mg | ORAL_TABLET | Freq: Once | ORAL | Status: AC
  Administered 2022-03-20: 20 mg via ORAL

## 2022-03-20 MED ORDER — MIDAZOLAM HCL 2 MG/2ML IJ SOLN
INTRAMUSCULAR | Status: AC
  Filled 2022-03-20: qty 2

## 2022-03-20 MED ORDER — BUPIVACAINE HCL (PF) 0.5 % IJ SOLN
INTRAMUSCULAR | Status: AC
  Filled 2022-03-20: qty 30

## 2022-03-20 MED ORDER — BUPIVACAINE HCL 0.5 % IJ SOLN
INTRAMUSCULAR | Status: DC | PRN
  Administered 2022-03-20: 14 mL

## 2022-03-20 MED ORDER — CHLORHEXIDINE GLUCONATE 0.12 % MT SOLN
OROMUCOSAL | Status: AC
  Filled 2022-03-20: qty 15

## 2022-03-20 MED ORDER — DROPERIDOL 2.5 MG/ML IJ SOLN
0.6250 mg | Freq: Once | INTRAMUSCULAR | Status: AC | PRN
  Administered 2022-03-20: 0.625 mg via INTRAVENOUS

## 2022-03-20 MED ORDER — KETAMINE HCL 10 MG/ML IJ SOLN
INTRAMUSCULAR | Status: DC | PRN
  Administered 2022-03-20: 20 mg via INTRAVENOUS

## 2022-03-20 MED ORDER — IBUPROFEN 600 MG PO TABS: 600.0000 mg | ORAL_TABLET | Freq: Four times a day (QID) | ORAL | 0 refills | Status: DC | PRN

## 2022-03-20 MED ORDER — LACTATED RINGERS IV SOLN: INTRAVENOUS | Status: DC

## 2022-03-20 MED ORDER — CEFAZOLIN SODIUM-DEXTROSE 2-4 GM/100ML-% IV SOLN
INTRAVENOUS | Status: AC
  Filled 2022-03-20: qty 100

## 2022-03-20 MED ORDER — ACETAMINOPHEN 10 MG/ML IV SOLN
INTRAVENOUS | Status: AC
  Filled 2022-03-20: qty 100

## 2022-03-20 MED ORDER — ONDANSETRON 4 MG PO TBDP: 4.0000 mg | ORAL_TABLET | Freq: Four times a day (QID) | ORAL | 0 refills | Status: AC | PRN

## 2022-03-20 MED ORDER — SILVER NITRATE-POT NITRATE 75-25 % EX MISC
CUTANEOUS | Status: AC
  Filled 2022-03-20: qty 10

## 2022-03-20 SURGICAL SUPPLY — 72 items
ADH SKN CLS APL DERMABOND .7 (GAUZE/BANDAGES/DRESSINGS) ×2
BAG DRN RND TRDRP ANRFLXCHMBR (UROLOGICAL SUPPLIES) ×2
BAG URINE DRAIN 2000ML AR STRL (UROLOGICAL SUPPLIES) ×3 IMPLANT
BASIN KIT SINGLE STR (MISCELLANEOUS) ×3 IMPLANT
BLADE SURG SZ11 CARB STEEL (BLADE) ×3 IMPLANT
CANNULA CAP OBTURATR AIRSEAL 8 (CAP) ×3 IMPLANT
CATH FOLEY 2WAY  5CC 16FR (CATHETERS) ×2
CATH FOLEY 2WAY 5CC 16FR (CATHETERS) ×2
CATH URTH 16FR FL 2W BLN LF (CATHETERS) ×3 IMPLANT
COVER TIP SHEARS 8 DVNC (MISCELLANEOUS) ×3 IMPLANT
COVER TIP SHEARS 8MM DA VINCI (MISCELLANEOUS) ×2
DERMABOND ADVANCED .7 DNX12 (GAUZE/BANDAGES/DRESSINGS) ×3 IMPLANT
DRAPE 3/4 80X56 (DRAPES) IMPLANT
DRAPE ARM DVNC X/XI (DISPOSABLE) ×12 IMPLANT
DRAPE DA VINCI XI ARM (DISPOSABLE) ×8
DRAPE ROBOT W/ LEGGING 30X125 (DRAPES) ×3 IMPLANT
DRAPE UNDER BUTTOCK W/FLU (DRAPES) ×3 IMPLANT
ELECT REM PT RETURN 9FT ADLT (ELECTROSURGICAL) ×2
ELECTRODE REM PT RTRN 9FT ADLT (ELECTROSURGICAL) ×3 IMPLANT
GAUZE 4X4 16PLY ~~LOC~~+RFID DBL (SPONGE) ×3 IMPLANT
GLOVE BIO SURGEON STRL SZ7 (GLOVE) ×9 IMPLANT
GLOVE SURG UNDER POLY LF SZ7.5 (GLOVE) ×9 IMPLANT
GOWN STRL REUS W/ TWL LRG LVL3 (GOWN DISPOSABLE) ×9 IMPLANT
GOWN STRL REUS W/ TWL XL LVL3 (GOWN DISPOSABLE) ×3 IMPLANT
GOWN STRL REUS W/TWL LRG LVL3 (GOWN DISPOSABLE) ×6
GOWN STRL REUS W/TWL XL LVL3 (GOWN DISPOSABLE) ×2
IRRIGATION STRYKERFLOW (MISCELLANEOUS) IMPLANT
IRRIGATOR STRYKERFLOW (MISCELLANEOUS)
IRRIGATOR SUCT 8 DISP DVNC XI (IRRIGATION / IRRIGATOR) IMPLANT
IRRIGATOR SUCTION 8MM XI DISP (IRRIGATION / IRRIGATOR)
IV LACTATED RINGERS 1000ML (IV SOLUTION) ×3 IMPLANT
IV NS 1000ML (IV SOLUTION) ×2
IV NS 1000ML BAXH (IV SOLUTION) ×3 IMPLANT
KIT PINK PAD W/HEAD ARE REST (MISCELLANEOUS) ×2
KIT PINK PAD W/HEAD ARM REST (MISCELLANEOUS) ×3 IMPLANT
KIT TURNOVER CYSTO (KITS) ×3 IMPLANT
LABEL OR SOLS (LABEL) ×3 IMPLANT
MANIFOLD NEPTUNE II (INSTRUMENTS) ×3 IMPLANT
MANIPULATOR UTERINE 4.5 ZUMI (MISCELLANEOUS) ×3 IMPLANT
MANIPULATOR VCARE LG CRV RETR (MISCELLANEOUS) IMPLANT
MANIPULATOR VCARE SML CRV RETR (MISCELLANEOUS) IMPLANT
MANIPULATOR VCARE STD CRV RETR (MISCELLANEOUS) IMPLANT
NEEDLE HYPO 22GX1.5 SAFETY (NEEDLE) ×3 IMPLANT
NS IRRIG 500ML POUR BTL (IV SOLUTION) ×3 IMPLANT
OBTURATOR OPTICAL STANDARD 8MM (TROCAR) ×2
OBTURATOR OPTICAL STND 8 DVNC (TROCAR) ×2
OBTURATOR OPTICALSTD 8 DVNC (TROCAR) ×3 IMPLANT
OCCLUDER COLPOPNEUMO (BALLOONS) IMPLANT
PACK LAP CHOLECYSTECTOMY (MISCELLANEOUS) ×3 IMPLANT
PAD PREP 24X41 OB/GYN DISP (PERSONAL CARE ITEMS) ×3 IMPLANT
SCISSORS METZENBAUM CVD 33 (INSTRUMENTS) IMPLANT
SCRUB CHG 4% DYNA-HEX 4OZ (MISCELLANEOUS) ×3 IMPLANT
SEAL CANN UNIV 5-8 DVNC XI (MISCELLANEOUS) ×9 IMPLANT
SEAL XI 5MM-8MM UNIVERSAL (MISCELLANEOUS) ×8
SEALER VESSEL DA VINCI XI (MISCELLANEOUS) ×2
SEALER VESSEL EXT DVNC XI (MISCELLANEOUS) ×3 IMPLANT
SET CYSTO W/LG BORE CLAMP LF (SET/KITS/TRAYS/PACK) ×3 IMPLANT
SET TUBE FILTERED XL AIRSEAL (SET/KITS/TRAYS/PACK) ×3 IMPLANT
SOLUTION ELECTROLUBE (MISCELLANEOUS) ×3 IMPLANT
SPONGE T-LAP 18X18 ~~LOC~~+RFID (SPONGE) IMPLANT
SURGILUBE 2OZ TUBE FLIPTOP (MISCELLANEOUS) ×3 IMPLANT
SUT DVC VLOC 180 0 12IN GS21 (SUTURE) ×2
SUT MNCRL 4-0 (SUTURE) ×4
SUT MNCRL 4-0 27XMFL (SUTURE) ×4
SUT VIC AB 0 CT2 27 (SUTURE) ×3 IMPLANT
SUT VLOC 90 S/L VL9 GS22 (SUTURE) ×3 IMPLANT
SUTURE DVC VLC 180 0 12IN GS21 (SUTURE) IMPLANT
SUTURE MNCRL 4-0 27XMF (SUTURE) ×3 IMPLANT
SYR 10ML LL (SYRINGE) ×3 IMPLANT
SYR 50ML LL SCALE MARK (SYRINGE) ×3 IMPLANT
TRAP FLUID SMOKE EVACUATOR (MISCELLANEOUS) ×3 IMPLANT
WATER STERILE IRR 500ML POUR (IV SOLUTION) ×3 IMPLANT

## 2022-03-20 NOTE — Transfer of Care (Signed)
Immediate Anesthesia Transfer of Care Note  Patient: Angela Levy  Procedure(s) Performed: XI ROBOTIC ASSISTED LAPAROSCOPIC HYSTERECTOMY AND SALPINGECTOMY (Bilateral: Abdomen) CYSTOSCOPY  Patient Location: PACU  Anesthesia Type:General  Level of Consciousness: awake, drowsy, and patient cooperative  Airway & Oxygen Therapy: Patient Spontanous Breathing and Patient connected to face mask oxygen  Post-op Assessment: Report given to RN and Post -op Vital signs reviewed and stable  Post vital signs: Reviewed and stable  Last Vitals:  Vitals Value Taken Time  BP 110/48 03/20/22 1045  Temp 36.3 C 03/20/22 1044  Pulse 77 03/20/22 1048  Resp 13 03/20/22 1048  SpO2 96 % 03/20/22 1048  Vitals shown include unvalidated device data.  Last Pain:  Vitals:   03/20/22 1044  TempSrc:   PainSc: 0-No pain      Patients Stated Pain Goal: 0 (50/38/88 2800)  Complications: No notable events documented.

## 2022-03-20 NOTE — Op Note (Signed)
Operative Note    Name: Angela Levy  Date of Service: 03/20/2022   DOB: 04-27-1995  MRN: [id number]    Pre-Operative Diagnosis: Menorrhagia with irregular cycle  Post-Operative Diagnosis: Menorrhagia with irregular cycle  Procedures:  1. Robot assisted Total Laparoscopic Hysterectomy, bilateral salpingectomy  2. Cystoscopy  Primary Surgeon: Prentice Docker, MD   EBL: 60 mL   IVF: 1,000 mL   Urine output: 300 mL clear urine at end of case  Specimens: Uterus, cervix, and bilateral fallopian tubes  Drains: none  Complications: None   Disposition: PACU   Condition: Stable   Findings:  1) Normal appearing uterus, cervix, bilateral fallopian tubes, and ovaries 2) An cystoscopy, no defects of bladder wall.  Efflux of urine noted from the bilateral ureteral orifices.   Procedure Summary:  The patient was taken to the operating room where general anesthesia was administered and found to be adequate. She was placed in the dorsal supine lithotomy position in [place] stirrups and prepped and draped in the usual, sterile fashion. After a timeout was called an indwelling catheter was placed in her bladder.  A sterile speculum was placed in her vagina.  The anterior lip of the cervix was grasped with the single-tooth tenaculum.  The cervix was serially dilated to an 11 Pratt dilator.  The large Vcare device was placed in accordance to the manufacturer's recommendations.  The tenaculum and speculum were removed.   Attention was turned to the abdomen where after injection of local anesthetic, an 8 mm infraumbilical incision was made with the scalpel. Entry into the abdomen was obtained via Optiview trocar technique (a blunt entry technique with camera visualization through the obturator upon entry). Verification of entry into the abdomen was obtained using opening pressures. The abdomen was insufflated with CO2. The camera was introduced through the trocar with verification of atraumatic entry.   Right and left abdominal entry sites were created after injection of local anesthetic about 8 cm lateral to the umbilical port in accordance with the Intuitive manufacturer's recommendations.  Due to some thin adhesions in the area of the appendix, adhesiolysis was undertaken in order to allow the last port site to be safely created.  This occurred without incident and allow the bowel to be displaced so that the fourth trocar could be inserted.  An additional port was placed 8 cm lateral to the right abdominal port with verification of clearance above the iliac crest by more than 2 cm.  The port sites were 8 mm.  The intuitive trochars were introduced under intra-abdominal camera visualization without difficulty   The XI robot was docked on the patient's left.  Clearance was verified from the patient's legs.  Through the umbilical port the camera was placed.  Through the port attached to arm 3 the monopolar scissors were placed.  Through the port attached to arm 4 the forced bipolar forceps were was placed.  The vessel sealer was attached to port 1.   An inspection was undertaken of the pelvis with the above-noted findings. The bilateral ureters were identified and found to be well away from the operative area of interest. The right fallopian tube was grasped at the fimbriated end and was transected using the vessel sealer along the mesosalpinx in a lateral to medial fashion. The vessel sealer was used to transect the right round ligament and the utero-ovarian ligament was transected. Tissue was divided along the right broad ligament to the level of the interior cervical os. Bladder tissue was dissected off  the lower uterine segment and cervix without difficulty. The right uterine artery was skeletonized and identified and after ligation was transected with the Vessel Sealer device. The same procedure was carried out on the left side. The colpotomy was performed using monopolar electrocautery in a  circumferential fashion following the KOH ring.  The uterus and fallopian tubes and cervix were removed through the vagina.   Closure of the vaginal cuff was undertaken using the V-lock stitch in a running fashion.  All vascular pedicles were inspected and found to be hemostatic.  All instruments removed from the robotic ports.  The robot was undocked from the patient.  The abdomen was then desufflated of CO2 with the aid of 5 deep breaths from anesthesia.  All trochars were then removed.  All skin incisions were closed using 4-0 Vicryl in a subcuticular fashion and reinforced using surgical skin glue.   Cystoscopy was undertaken at this point. The Foley catheter was removed and the 70 cystoscope was gently introduced through the urethra. The bladder survey was undertaken with efflux of urine from both orifices noted. There were no defects noted in the bladder wall. The cystoscope was utilized to fully empty the bladder.  A digital sweep of the vagina was undertaken to ensure no instruments or sponges remained and this was verified to be clear. A speculum was used to verify that no active bleeding was occurring from the vaginal closure or  from any inadvertent vaginal lacerations.  After noting no bleeding, the speculum was removed.   The patient tolerated the procedure well.  Sponge, lap, needle, and instrument counts were correct x 2.  VTE prophylaxis: SCDs. Antibiotic prophylaxis: Ancef 2 grams IV prior to skin incision. She was awakened in the operating room and was taken to the PACU in stable condition.   Prentice Docker, MD, [facility] 03/20/2022 10:35 AM

## 2022-03-20 NOTE — Discharge Instructions (Signed)
AMBULATORY SURGERY  ?DISCHARGE INSTRUCTIONS ? ? ?The drugs that you were given will stay in your system until tomorrow so for the next 24 hours you should not: ? ?Drive an automobile ?Make any legal decisions ?Drink any alcoholic beverage ? ? ?You may resume regular meals tomorrow.  Today it is better to start with liquids and gradually work up to solid foods. ? ?You may eat anything you prefer, but it is better to start with liquids, then soup and crackers, and gradually work up to solid foods. ? ? ?Please notify your doctor immediately if you have any unusual bleeding, trouble breathing, redness and pain at the surgery site, drainage, fever, or pain not relieved by medication. ? ? ? ?Additional Instructions: ? ? ? ?Please contact your physician with any problems or Same Day Surgery at [phone], Monday through Friday 6 am to 4 pm, or [place] [facility] number at [phone].  ?

## 2022-03-20 NOTE — Interval H&P Note (Signed)
History and Physical Interval Note:  03/20/2022 7:21 AM  Angela Levy  has presented today for surgery, with the diagnosis of menorrhagia with irregular cycle.  The various methods of treatment have been discussed with the patient and family. After consideration of risks, benefits and other options for treatment, the patient has consented to  Procedure(s): XI ROBOTIC ASSISTED LAPAROSCOPIC HYSTERECTOMY AND SALPINGECTOMY (Bilateral) CYSTOSCOPY (N/A) as a surgical intervention.  The patient's history has been reviewed, patient examined, no change in status, stable for surgery.  I have reviewed the patient's chart and labs.  Questions were answered to the patient's satisfaction.  Consents reviewed. Patient agrees to proceed.  Prentice Docker, MD, [facility] 03/20/2022 7:21 AM

## 2022-03-20 NOTE — Anesthesia Postprocedure Evaluation (Signed)
Anesthesia Post Note  Patient: Angela Levy  Procedure(s) Performed: XI ROBOTIC ASSISTED LAPAROSCOPIC HYSTERECTOMY AND SALPINGECTOMY (Bilateral: Abdomen) CYSTOSCOPY  Patient location during evaluation: PACU Anesthesia Type: General Level of consciousness: awake and alert Pain management: pain level controlled Vital Signs Assessment: post-procedure vital signs reviewed and stable Respiratory status: spontaneous breathing, nonlabored ventilation, respiratory function stable and patient connected to nasal cannula oxygen Cardiovascular status: blood pressure returned to baseline and stable Postop Assessment: no apparent nausea or vomiting Anesthetic complications: no   No notable events documented.   Last Vitals:  Vitals:   03/20/22 1145 03/20/22 1200  BP: (!) 103/56 (!) 101/58  Pulse: 81 77  Resp: 10 10  Temp:    SpO2: 96% 96%    Last Pain:  Vitals:   03/20/22 1200  TempSrc:   PainSc: [place]

## 2022-03-20 NOTE — Anesthesia Procedure Notes (Addendum)

## 2022-03-20 NOTE — Anesthesia Preprocedure Evaluation (Addendum)
Anesthesia Evaluation  Patient identified by MRN, date of birth, ID band Patient awake    Reviewed: Allergy & Precautions, NPO status , Patient's Chart, lab work & pertinent test results  History of Anesthesia Complications Negative for: history of anesthetic complications  Airway Mallampati: II  TM Distance: >3 FB Neck ROM: full    Dental no notable dental hx.    Pulmonary neg pulmonary ROS   Pulmonary exam normal        Cardiovascular negative cardio ROS Normal cardiovascular exam     Neuro/Psych negative neurological ROS  negative psych ROS   GI/Hepatic negative GI ROS, Neg liver ROS,,,  Endo/Other  negative endocrine ROS    Renal/GU      Musculoskeletal   Abdominal   Peds  Hematology  (+) Blood dyscrasia, anemia   Anesthesia Other Findings Past Medical History: No date: Anemia during pregnancy No date: Anxiety No date: Migraines No date: POTS (postural orthostatic tachycardia syndrome) No date: Sciatica of right side No date: Scoliosis  Past Surgical History: No date: NO PAST SURGERIES  BMI    Body Mass Index: 25.06 kg/m      Reproductive/Obstetrics negative OB ROS                             Anesthesia Physical Anesthesia Plan  ASA: 2  Anesthesia Plan: General ETT   Post-op Pain Management: Ofirmev IV (intra-op)*, Toradol IV (intra-op)* and Dilaudid IV   Induction: Intravenous  PONV Risk Score and Plan: 4 or greater and Ondansetron, Dexamethasone, Midazolam and Treatment may vary due to age or medical condition  Airway Management Planned: Oral ETT  Additional Equipment:   Intra-op Plan:   Post-operative Plan: Extubation in OR  Informed Consent: I have reviewed the patients History and Physical, chart, labs and discussed the procedure including the risks, benefits and alternatives for the proposed anesthesia with the patient or authorized representative who  has indicated his/her understanding and acceptance.     Dental Advisory Given  Plan Discussed with: Anesthesiologist, CRNA and Surgeon  Anesthesia Plan Comments: (Patient consented for risks of anesthesia including but not limited to:  - adverse reactions to medications - damage to eyes, teeth, lips or other oral mucosa - nerve damage due to positioning  - sore throat or hoarseness - Damage to heart, brain, nerves, lungs, other parts of body or loss of life  Patient voiced understanding.)       Anesthesia Quick Evaluation

## 2022-03-23 LAB — SURGICAL PATHOLOGY

## 2023-05-01 ENCOUNTER — Emergency Department

## 2023-05-01 ENCOUNTER — Other Ambulatory Visit: Payer: Self-pay

## 2023-05-01 ENCOUNTER — Emergency Department
Admission: EM | Admit: 2023-05-01 | Discharge: 2023-05-01 | Disposition: A | Discharge status: Home / Self Care | Attending: Student in an Organized Health Care Education/Training Program | Admitting: Student in an Organized Health Care Education/Training Program

## 2023-05-01 DIAGNOSIS — R1084 Generalized abdominal pain: Secondary | ICD-10-CM | POA: Insufficient documentation

## 2023-05-01 DIAGNOSIS — R079 Chest pain, unspecified: Secondary | ICD-10-CM | POA: Insufficient documentation

## 2023-05-01 DIAGNOSIS — R197 Diarrhea, unspecified: Secondary | ICD-10-CM | POA: Diagnosis not present

## 2023-05-01 DIAGNOSIS — R112 Nausea with vomiting, unspecified: Secondary | ICD-10-CM | POA: Insufficient documentation

## 2023-05-01 DIAGNOSIS — D72829 Elevated white blood cell count, unspecified: Secondary | ICD-10-CM | POA: Insufficient documentation

## 2023-05-01 DIAGNOSIS — R0602 Shortness of breath: Secondary | ICD-10-CM | POA: Insufficient documentation

## 2023-05-01 LAB — BASIC METABOLIC PANEL
Anion gap: 12 (ref 5–15)
BUN: 24 mg/dL — ABNORMAL HIGH (ref 6–20)
CO2: 19 mmol/L — ABNORMAL LOW (ref 22–32)
Calcium: 9.4 mg/dL (ref 8.9–10.3)
Chloride: 105 mmol/L (ref 98–111)
Creatinine, Ser: 0.74 mg/dL (ref 0.44–1.00)
GFR, Estimated: >60 mL/min (ref >60)
Glucose, Bld: 101 mg/dL — ABNORMAL HIGH (ref 70–99)
Potassium: 3.9 mmol/L (ref 3.5–5.1)
Sodium: 136 mmol/L (ref 135–145)

## 2023-05-01 LAB — HEPATIC FUNCTION PANEL
ALT: 16 U/L (ref 0–44)
AST: 16 U/L (ref 15–41)
Albumin: 4.5 g/dL (ref 3.5–5.0)
Alkaline Phosphatase: 63 U/L (ref 38–126)
Bilirubin, Direct: 0.2 mg/dL (ref 0.0–0.2)
Indirect Bilirubin: 1.2 mg/dL — ABNORMAL HIGH (ref 0.3–0.9)
Total Bilirubin: 1.4 mg/dL — ABNORMAL HIGH (ref 0.0–1.2)
Total Protein: 7.8 g/dL (ref 6.5–8.1)

## 2023-05-01 LAB — CBC
HCT: 46.9 % — ABNORMAL HIGH (ref 36.0–46.0)
Hemoglobin: 15.8 g/dL — ABNORMAL HIGH (ref 12.0–15.0)
MCH: 28.7 pg (ref 26.0–34.0)
MCHC: 33.7 g/dL (ref 30.0–36.0)
MCV: 85.3 fL (ref 80.0–100.0)
Platelets: 190 10*3/uL (ref 150–400)
RBC: 5.5 MIL/uL — ABNORMAL HIGH (ref 3.87–5.11)
RDW: 13.1 % (ref 11.5–15.5)
WBC: 14.1 10*3/uL — ABNORMAL HIGH (ref 4.0–10.5)
nRBC: 0 % (ref 0.0–0.2)

## 2023-05-01 LAB — TROPONIN I (HIGH SENSITIVITY): Troponin I (High Sensitivity): <2 ng/L (ref <18)

## 2023-05-01 LAB — LIPASE, BLOOD: Lipase: 24 U/L (ref 11–51)

## 2023-05-01 MED ORDER — LACTATED RINGERS IV BOLUS
1000.0000 mL | Freq: Once | INTRAVENOUS | Status: AC
  Administered 2023-05-01: 1000 mL via INTRAVENOUS

## 2023-05-01 MED ORDER — DROPERIDOL 2.5 MG/ML IJ SOLN
2.5000 mg | Freq: Once | INTRAMUSCULAR | Status: AC
  Administered 2023-05-01: 2.5 mg via INTRAVENOUS
  Filled 2023-05-01: qty 2

## 2023-05-01 NOTE — ED Provider Notes (Signed)
 [facility] Provider Note    Event Date/Time   First MD Initiated Contact with Patient 05/01/23 2000     (approximate)   History   Multiple Complaints    HPI  Angela Levy is a 28 y.o. female presents to the ER for evaluation of 24 hours of chest pain shortness of breath nausea vomiting.  Unable to keep anything down.  Describes generalized crampy abdominal pain.  Denies any dysuria no diarrhea.  Status post hysterectomy.     Physical Exam   Triage Vital Signs: ED Triage Vitals  Encounter Vitals Group     BP 05/01/23 1931 104/76     Systolic BP Percentile --      Diastolic BP Percentile --      Pulse Rate 05/01/23 1931 97     Resp 05/01/23 1931 14     Temp 05/01/23 1931 97.7 F (36.5 C)     Temp Source 05/01/23 1931 Oral     SpO2 05/01/23 1931 100 %     Weight 05/01/23 1927 142 lb 6.7 oz (64.6 kg)     Height 05/01/23 1927 5\' 7"  (1.702 m)     Head Circumference --      Peak Flow --      Pain Score 05/01/23 1927 4     Pain Loc --      Pain Education --      Exclude from Growth Chart --     Most recent vital signs: Vitals:   05/01/23 1931  BP: 104/76  Pulse: 97  Resp: 14  Temp: 97.7 F (36.5 C)  SpO2: 100%     Constitutional: Alert  Eyes: Conjunctivae are normal.  Head: Atraumatic. Nose: No congestion/rhinnorhea. Mouth/Throat: Mucous membranes are dry Neck: Painless ROM.  Cardiovascular:   Good peripheral circulation. Respiratory: Normal respiratory effort.  No retractions.  Gastrointestinal: Soft and nontender.  Musculoskeletal:  no deformity Neurologic:  MAE spontaneously. No gross focal neurologic deficits are appreciated.  Skin:  Skin is warm, dry and intact. No rash noted. Psychiatric: anxious    ED Results / Procedures / Treatments   Labs (all labs ordered are listed, but only abnormal results are displayed) Labs Reviewed  BASIC METABOLIC PANEL - Abnormal; Notable for the following components:      Result  Value   CO2 19 (*)    Glucose, Bld 101 (*)    BUN 24 (*)    All other components within normal limits  CBC - Abnormal; Notable for the following components:   WBC 14.1 (*)    RBC 5.50 (*)    Hemoglobin 15.8 (*)    HCT 46.9 (*)    All other components within normal limits  HEPATIC FUNCTION PANEL - Abnormal; Notable for the following components:   Total Bilirubin 1.4 (*)    Indirect Bilirubin 1.2 (*)    All other components within normal limits  LIPASE, BLOOD  TROPONIN I (HIGH SENSITIVITY)     EKG  ED ECG REPORT I, Willy Eddy, the attending physician, personally viewed and interpreted this ECG.   Date: 05/01/2023  EKG Time: 20:23  Rate: 90  Rhythm: sinus  Axis: normal  Intervals: normal  ST&T Change: no stemi, no depressions    RADIOLOGY Please see ED Course for my review and interpretation.  I personally reviewed all radiographic images ordered to evaluate for the above acute complaints and reviewed radiology reports and findings.  These findings were personally discussed with the patient.  Please see medical record for radiology report.    PROCEDURES:  Critical Care performed: No  Procedures   MEDICATIONS ORDERED IN ED: Medications  lactated ringers bolus 1,000 mL (0 mLs Intravenous Stopped 05/01/23 2237)  droperidol (INAPSINE) 2.5 MG/ML injection 2.5 mg (2.5 mg Intravenous Given 05/01/23 2022)     IMPRESSION / MDM / ASSESSMENT AND PLAN / ED COURSE  I reviewed the triage vital signs and the nursing notes.                              Differential diagnosis includes, but is not limited to, gastritis, enteritis, viral illness, ACS, pericarditis, pneumonia, pneumothorax, PE, biliary pathology, pancreatitis  Patient presenting to the ER for evaluation of symptoms as described above.  Based on symptoms, risk factors and considered above differential, this presenting complaint could reflect a potentially life-threatening illness therefore the patient will  be placed on continuous pulse oximetry and telemetry for monitoring.  Laboratory evaluation will be sent to evaluate for the above complaints.  Patient very anxious appearing she is nontoxic however.  EKG with some nonspecific changes will repeat.  Mild leukocytosis no significant anemia does appear concentrated.  Will give IV fluids as well as IV droperidol.  Will check labs and reassess.  She is lowers by Wells criteria she is PERC negative.   Clinical Course as of 05/01/23 2249  Sat May 01, 2023  2235 Patient's workup is reassuring.  Patient came in nursing just requesting removal of IV and to be discharged as one of her kids at home is sick.  Patient's workup is reassuring.  I do believe she is appropriate for outpatient follow-up.  She is requesting a work note. [PR]    Clinical Course User Index [PR] Willy Eddy, MD     FINAL CLINICAL IMPRESSION(S) / ED DIAGNOSES   Final diagnoses:  Nausea and vomiting, unspecified vomiting type     Rx / DC Orders   ED Discharge Orders     None        Note:  This document was prepared using Dragon voice recognition software and may include unintentional dictation errors.    Willy Eddy, MD 05/01/23 2250

## 2023-05-01 NOTE — ED Triage Notes (Signed)
 Pt arrived POV for CP to right upper chest that radiates into her arm and across her chest associated with SOB that started yesterday while sitting. Also reports n/v/d yesterday as well as experiencing chills. Pt also states she has been losing weight as well for unknown reason. Currently NAD noted, A&O x4. Pt is anemic, has had to receive blood transfusions in the past.

## 2023-07-19 ENCOUNTER — Other Ambulatory Visit: Payer: Self-pay

## 2023-07-19 ENCOUNTER — Emergency Department

## 2023-07-19 ENCOUNTER — Emergency Department
Admission: EM | Admit: 2023-07-19 | Discharge: 2023-07-19 | Disposition: A | Discharge status: Home / Self Care | Attending: Emergency Medicine | Admitting: Emergency Medicine

## 2023-07-19 DIAGNOSIS — Y990 Civilian activity done for income or pay: Secondary | ICD-10-CM | POA: Insufficient documentation

## 2023-07-19 DIAGNOSIS — N921 Excessive and frequent menstruation with irregular cycle: Secondary | ICD-10-CM

## 2023-07-19 DIAGNOSIS — M25572 Pain in left ankle and joints of left foot: Secondary | ICD-10-CM | POA: Diagnosis present

## 2023-07-19 DIAGNOSIS — W1789XA Other fall from one level to another, initial encounter: Secondary | ICD-10-CM | POA: Insufficient documentation

## 2023-07-19 DIAGNOSIS — S93402A Sprain of unspecified ligament of left ankle, initial encounter: Secondary | ICD-10-CM | POA: Insufficient documentation

## 2023-07-19 MED ORDER — IBUPROFEN 600 MG PO TABS: 600.0000 mg | ORAL_TABLET | Freq: Four times a day (QID) | ORAL | 0 refills | Status: AC | PRN

## 2023-07-19 NOTE — ED Provider Notes (Signed)
 [place] EMERGENCY DEPARTMENT AT [facility] Provider Note   CSN: [id number] Arrival date & time: 07/19/23  0900     History  Chief Complaint  Patient presents with   Angela Levy is a 28 y.o. female.  Patient here for fall.  Had a mechanical fall earlier today fell out of truck rolled left ankle.  Now has pain of same mostly lateral aspect.  This was a workplace injury where she works for [employer].  Denies any injuries elsewhere.  No history of surgery to this joint.  Denies current pregnancy status post hysterectomy.   Fall Pertinent negatives include no chest pain, no abdominal pain and no shortness of breath.       Home Medications Prior to Admission medications   Medication Sig Start Date End Date Taking? Authorizing Provider  ibuprofen  (ADVIL ) 600 MG tablet Take 1 tablet (600 mg total) by mouth every 6 (six) hours as needed for mild pain (pain score 1-3) or cramping. 07/19/23   Hollie Luria, PA-C  ondansetron  (ZOFRAN -ODT) 4 MG disintegrating tablet Take 1 tablet (4 mg total) by mouth every 6 (six) hours as needed for nausea. Patient not taking: Reported on 05/01/2023 03/20/22   Kris Pester, MD      Allergies    Patient has no known allergies.    Review of Systems   Review of Systems  Constitutional:  Negative for chills and fever.  HENT:  Negative for congestion.   Respiratory:  Negative for cough and shortness of breath.   Cardiovascular:  Negative for chest pain.  Gastrointestinal:  Negative for abdominal pain, diarrhea, nausea and vomiting.  Genitourinary:  Negative for dysuria.  Musculoskeletal:  Positive for arthralgias.  Skin:  Negative for wound.  Neurological:  Negative for weakness and numbness.    Physical Exam Updated Vital Signs BP 104/81   Pulse (!) 56   Temp 98 F (36.7 C)   Resp 18   Ht 5\' 7"  (1.702 m)   Wt 63.5 kg   LMP  (LMP Unknown) Comment: patient reports that she has continuous bleeding  SpO2 100%   BMI 21.93  kg/m  Physical Exam Vitals reviewed.  Constitutional:      Appearance: Normal appearance.  HENT:     Head: Normocephalic and atraumatic.     Nose: Nose normal.  Cardiovascular:     Pulses: Normal pulses.  Pulmonary:     Effort: Pulmonary effort is normal.  Abdominal:     Tenderness: There is no abdominal tenderness.     Hernia: No hernia is present.  Musculoskeletal:     Cervical back: Normal range of motion.     Comments: No significant swelling or bruising mild tenderness lateral aspect posterior to malleolus.  Dorsal pedal pulse intact.  Capillary refill less than 2 seconds.  Full range of motion.  No calf swelling or tenderness.  Achilles tendon feels intact.  Neurological:     Mental Status: She is alert and oriented to person, place, and time. Mental status is at baseline.  Psychiatric:        Mood and Affect: Mood normal.        Behavior: Behavior normal.     ED Results / Procedures / Treatments   Labs (all labs ordered are listed, but only abnormal results are displayed) Labs Reviewed - No data to display  EKG None  Radiology DG Ankle Complete Left Result Date: 07/19/2023 CLINICAL DATA:  Fall out of truck at work  landing on left ankle. EXAM: LEFT ANKLE COMPLETE - 3+ VIEW COMPARISON:  None Available. FINDINGS: Normal bone mineralization. The ankle mortise is symmetric and intact. Joint spaces are preserved. No acute fracture or dislocation. No significant soft tissue swelling. IMPRESSION: Normal left ankle radiographs. Electronically Signed   By: Bertina Broccoli M.D.   On: 07/19/2023 09:30    Procedures Procedures    Medications Ordered in ED Medications - No data to display  ED Course/ Medical Decision Making/ A&P                                 Medical Decision Making Patient here for mechanical fall workplace injury earlier today twisted left ankle does have intact neurovasculature on exam minimal tenderness no bruising.  Low suspicion of fracture or  dislocation however we will check x-ray.  X-ray is overall unremarkable.  No acute findings.  My independent interpretation of left ankle x-ray overall unremarkable no fractures identified.  Did discuss the low suspicion for occult fracture.  Recommend RICE, NSAIDs, immobilized.  Follow-up with Ortho.  Given return precautions here.  Amount and/or Complexity of Data Reviewed Radiology: ordered.  Risk Prescription drug management.           Final Clinical Impression(s) / ED Diagnoses Final diagnoses:  Sprain of left ankle, unspecified ligament, initial encounter    Rx / DC Orders ED Discharge Orders          Ordered    ibuprofen  (ADVIL ) 600 MG tablet  Every 6 hours PRN        07/19/23 0945              Hollie Luria, PA-C 07/19/23 1610    Viviano Ground, MD 07/19/23 [phone]

## 2023-07-19 NOTE — ED Triage Notes (Signed)
 Pt comes with fall out of truck while at work for [employer]. Pt states she landed on her left ankle.

## 2023-07-19 NOTE — ED Notes (Signed)
 See triage notes. Patient c/o left ankle pain secondary to stepping out of her truck and rolling her ankle.

## 2023-07-19 NOTE — Discharge Instructions (Addendum)
 Recommend rest ice and elevation.  Take Tylenol 650 mg 4 times daily.  You may also take ibuprofen 600 mg 3 times daily with food.  Follow-up with orthopedics for further evaluation.  Return here for new or worse symptoms including numbness and weakness.

## 2023-08-06 ENCOUNTER — Other Ambulatory Visit: Payer: Self-pay

## 2023-08-06 ENCOUNTER — Emergency Department

## 2023-08-06 ENCOUNTER — Encounter: Payer: Self-pay | Admitting: Emergency Medicine

## 2023-08-06 ENCOUNTER — Emergency Department
Admission: EM | Admit: 2023-08-06 | Discharge: 2023-08-06 | Disposition: A | Discharge status: Home / Self Care | Attending: Emergency Medicine | Admitting: Emergency Medicine

## 2023-08-06 DIAGNOSIS — S99912D Unspecified injury of left ankle, subsequent encounter: Secondary | ICD-10-CM

## 2023-08-06 DIAGNOSIS — X509XXD Other and unspecified overexertion or strenuous movements or postures, subsequent encounter: Secondary | ICD-10-CM | POA: Insufficient documentation

## 2023-08-06 DIAGNOSIS — N390 Urinary tract infection, site not specified: Secondary | ICD-10-CM

## 2023-08-06 DIAGNOSIS — Y990 Civilian activity done for income or pay: Secondary | ICD-10-CM | POA: Diagnosis not present

## 2023-08-06 LAB — URINALYSIS, W/ REFLEX TO CULTURE (INFECTION SUSPECTED)
Bilirubin Urine: NEGATIVE
Glucose, UA: NEGATIVE mg/dL
Ketones, ur: NEGATIVE mg/dL
Nitrite: NEGATIVE
Protein, ur: NEGATIVE mg/dL
Specific Gravity, Urine: 1.017 (ref 1.005–1.030)
WBC, UA: >50 WBC/hpf (ref 0–5)
pH: 5 (ref 5.0–8.0)

## 2023-08-06 MED ORDER — CEPHALEXIN 500 MG PO CAPS
500.0000 mg | ORAL_CAPSULE | Freq: Two times a day (BID) | ORAL | 0 refills | Status: AC
Start: 2023-08-06 — End: ?

## 2023-08-06 MED ORDER — IBUPROFEN 800 MG PO TABS
800.0000 mg | ORAL_TABLET | Freq: Once | ORAL | Status: AC
  Administered 2023-08-06: 800 mg via ORAL
  Filled 2023-08-06: qty 1

## 2023-08-06 MED ORDER — CEPHALEXIN 500 MG PO CAPS
500.0000 mg | ORAL_CAPSULE | Freq: Once | ORAL | Status: AC
  Administered 2023-08-06: 500 mg via ORAL
  Filled 2023-08-06: qty 1

## 2023-08-06 NOTE — ED Provider Notes (Signed)
 [facility] Provider Note    Event Date/Time   First MD Initiated Contact with Patient 08/06/23 [phone]     (approximate)   History   Ankle Pain   HPI  AMI Angela Levy is a 28 y.o. female patient with history of POTS, migraines, anxiety who presents to the emergency department with 2 complaints.  Reports 3 to 4 days of dysuria.  No vaginal bleeding, discharge.  She has had a previous hysterectomy.  Denies abdominal pain, flank pain, vomiting, diarrhea, fever.  Also complains of continued left ankle pain that she feels like is getting worse instead of better after a work injury.  She reports the beginning of June she fell out of a truck and rolled her left ankle while working for [employer].  She was seen in the emergency department on 07/19/2023 and had x-rays which were negative for acute bony abnormality.  She states she was told that sometimes x-rays can initially be normal and then a fracture can show up later.  She is requesting repeat imaging today as she feels like her pain is worsening instead of improving.  She denies any new injury.  She states it is painful to ambulate.   History provided by patient.    Past Medical History:  Diagnosis Date   Anemia during pregnancy    Anxiety    Migraines    POTS (postural orthostatic tachycardia syndrome)    Sciatica of right side    Scoliosis     Past Surgical History:  Procedure Laterality Date   ABDOMINAL HYSTERECTOMY     CYSTOSCOPY N/A 03/20/2022   Procedure: CYSTOSCOPY;  Surgeon: Kris Pester, MD;  Location: [facility] ORS;  Service: Gynecology;  Laterality: N/A;   NO PAST SURGERIES     ROBOTIC ASSISTED LAPAROSCOPIC HYSTERECTOMY AND SALPINGECTOMY Bilateral 03/20/2022   Procedure: XI ROBOTIC ASSISTED LAPAROSCOPIC HYSTERECTOMY AND SALPINGECTOMY;  Surgeon: Kris Pester, MD;  Location: [facility] ORS;  Service: Gynecology;  Laterality: Bilateral;    MEDICATIONS:  Prior to Admission medications   Medication  Sig Start Date End Date Taking? Authorizing Provider  ibuprofen  (ADVIL ) 600 MG tablet Take 1 tablet (600 mg total) by mouth every 6 (six) hours as needed for mild pain (pain score 1-3) or cramping. 07/19/23   Hollie Luria, PA-C  ondansetron  (ZOFRAN -ODT) 4 MG disintegrating tablet Take 1 tablet (4 mg total) by mouth every 6 (six) hours as needed for nausea. Patient not taking: Reported on 05/01/2023 03/20/22   Kris Pester, MD    Physical Exam   Triage Vital Signs: ED Triage Vitals  Encounter Vitals Group     BP 08/06/23 0635 102/76     Girls Systolic BP Percentile --      Girls Diastolic BP Percentile --      Boys Systolic BP Percentile --      Boys Diastolic BP Percentile --      Pulse Rate 08/06/23 0635 78     Resp 08/06/23 0635 18     Temp 08/06/23 0635 98.3 F (36.8 C)     Temp Source 08/06/23 0635 Oral     SpO2 08/06/23 0635 99 %     Weight 08/06/23 0630 145 lb (65.8 kg)     Height 08/06/23 0630 5' 7 (1.702 m)     Head Circumference --      Peak Flow --      Pain Score 08/06/23 0630 6     Pain Loc --  Pain Education --      Exclude from Growth Chart --      Most recent vital signs: Vitals:   08/06/23 0635 08/06/23 0700  BP: 102/76 (!) 102/50  Pulse: 78 (!) 57  Resp: 18   Temp: 98.3 F (36.8 C)   SpO2: 99% 100%     CONSTITUTIONAL: Alert and responds appropriately to questions. Well-appearing; well-nourished HEAD: Normocephalic, atraumatic EYES: Conjunctivae clear, pupils appear equal ENT: normal nose; moist mucous membranes NECK: Normal range of motion CARD: Regular rate and rhythm RESP: Normal chest excursion without splinting or tachypnea; no hypoxia or respiratory distress, speaking full sentences ABD/GI: non-distended, soft, nontender to palpation EXT: Normal ROM in all joints, no major deformities noted.  Tender diffusely over the left medial lateral ankle without soft tissue swelling, ecchymosis, increased redness or warmth.  No joint effusion  noted.  No calf tenderness or calf swelling.  Compartments in the left leg are soft.  2+ left DP pulse.  Normal capillary refill. SKIN: Normal color for age and race, no rashes on exposed skin NEURO: Moves all extremities equally, normal speech, no facial asymmetry noted PSYCH: The patient's mood and manner are appropriate. Grooming and personal hygiene are appropriate.  ED Results / Procedures / Treatments   LABS: (all labs ordered are listed, but only abnormal results are displayed) Labs Reviewed  URINALYSIS, W/ REFLEX TO CULTURE (INFECTION SUSPECTED) - Abnormal; Notable for the following components:      Result Value   Color, Urine YELLOW (*)    APPearance CLOUDY (*)    Hgb urine dipstick SMALL (*)    Leukocytes,Ua MODERATE (*)    Bacteria, UA RARE (*)    All other components within normal limits     EKG:    RADIOLOGY: My personal review and interpretation of imaging: X-ray unremarkable.  I have personally reviewed all radiology reports. DG Ankle Complete Left Result Date: 08/06/2023 CLINICAL DATA:  Left ankle pain after fall and twisting injury 1 week ago. Pain localizes around lateral malleolus and posterior calcaneus. EXAM: LEFT ANKLE COMPLETE - 3+ VIEW COMPARISON:  07/19/2023 FINDINGS: There is no evidence of fracture, dislocation, or joint effusion. There is no evidence of arthropathy or other focal bone abnormality. Soft tissues are unremarkable. IMPRESSION: Negative. Electronically Signed   By: Kimberley Penman M.D.   On: 08/06/2023 07:01     PROCEDURES:  Critical Care performed: No    Procedures    IMPRESSION / MDM / ASSESSMENT AND PLAN / ED COURSE  I reviewed the triage vital signs and the nursing notes.   Patient here with complaints of dysuria as well as continued left ankle pain after work injury.     DIFFERENTIAL DIAGNOSIS (includes but not limited to):   UTI, STI, doubt torsion, ruptured ovarian cyst, appendicitis, pyelonephritis, kidney stone given  benign exam  Ankle sprain, tendinitis, arthritis, nonhealing fracture.  No sign of arterial obstruction, DVT, compartment syndrome, gout, septic arthritis.  Patient's presentation is most consistent with acute complicated illness / injury requiring diagnostic workup.  PLAN: Will obtain urinalysis.  Will repeat x-ray of the left ankle.  Will give ibuprofen  for pain.   MEDICATIONS GIVEN IN ED: Medications  cephALEXin (KEFLEX) capsule 500 mg (has no administration in time range)  ibuprofen  (ADVIL ) tablet 800 mg (800 mg Oral Given 08/06/23 1610)     ED COURSE: X-ray reviewed and interpreted by myself and the radiologist and shows no acute abnormality.  Recommended Tylenol , ibuprofen , rest, ice.  Will place  in an Ace wrap.  Recommended follow-up with orthopedics as she may need additional imaging as an outpatient such as an MRI if symptoms not improving.  Urinalysis appears infected.  Culture pending.  Will start her on antibiotics.  At this time, I do not feel there is any life-threatening condition present. I reviewed all nursing notes, vitals, pertinent previous records.  All lab and urine results, EKGs, imaging ordered have been independently reviewed and interpreted by myself.  I reviewed all available radiology reports from any imaging ordered this visit.  Based on my assessment, I feel the patient is safe to be discharged home without further emergent workup and can continue workup as an outpatient as needed. Discussed all findings, treatment plan as well as usual and customary return precautions.  They verbalize understanding and are comfortable with this plan.  Outpatient follow-up has been provided as needed.  All questions have been answered.    CONSULTS:  none   OUTSIDE RECORDS REVIEWED: Reviewed previous OB/GYN notes.     FINAL CLINICAL IMPRESSION(S) / ED DIAGNOSES   Final diagnoses:  Left ankle injury, subsequent encounter  Acute UTI     Rx / DC Orders   ED Discharge  Orders          Ordered    cephALEXin (KEFLEX) 500 MG capsule  2 times daily        08/06/23 0705             Note:  This document was prepared using Dragon voice recognition software and may include unintentional dictation errors.   Eugenio Dollins, Clover Dao, DO 08/06/23 [phone]

## 2023-08-06 NOTE — Discharge Instructions (Addendum)
 You may alternate over the counter Tylenol  1000 mg every 6 hours as needed for pain, fever and Ibuprofen  800 mg every 6-8 hours as needed for pain, fever.  Please take Ibuprofen  with food.  Do not take more than 4000 mg of Tylenol  (acetaminophen ) in a 24 hour period.  Your x-ray showed **.  Please follow-up with orthopedics for further management.  Your urine showed **.  Please follow-up with your primary care provider.

## 2023-08-06 NOTE — ED Notes (Signed)
 Ace wrap applied to left ankle with no issue. Pt ambulated to lobby after dc.

## 2023-08-06 NOTE — ED Triage Notes (Signed)
 Patient ambulatory to triage with steady gait, without difficulty or distress noted; pt reports seen for ankle sprain recently and was given a brace but pain persists; did not f/u with emerg ortho as instructed

## 2023-08-29 ENCOUNTER — Other Ambulatory Visit: Payer: Self-pay

## 2023-08-29 DIAGNOSIS — Z5321 Procedure and treatment not carried out due to patient leaving prior to being seen by health care provider: Secondary | ICD-10-CM | POA: Insufficient documentation

## 2023-08-29 DIAGNOSIS — K0889 Other specified disorders of teeth and supporting structures: Secondary | ICD-10-CM | POA: Insufficient documentation

## 2023-08-29 NOTE — ED Triage Notes (Signed)
 Pt to ed from home via POV for dental pain x 2 days. Pt has taken ibuprofen  and tylenol  with no relief. Pt is caox4, in no acute distress and ambulatory in triage. Pain in left upper teeth area.

## 2023-08-30 ENCOUNTER — Emergency Department
Admission: EM | Admit: 2023-08-30 | Discharge: 2023-08-30 | Discharge status: Against Medical Advice | Attending: Emergency Medicine | Admitting: Emergency Medicine

## 2023-08-30 NOTE — ED Notes (Signed)
 No answer when called for treatment room x2. No answer when called patient phone listed in chart.

## 2024-03-04 ENCOUNTER — Encounter: Payer: Self-pay | Admitting: Emergency Medicine

## 2024-03-04 ENCOUNTER — Emergency Department
Admission: EM | Admit: 2024-03-04 | Discharge: 2024-03-04 | Disposition: A | Discharge status: Home / Self Care | Attending: Emergency Medicine | Admitting: Emergency Medicine

## 2024-03-04 ENCOUNTER — Other Ambulatory Visit: Payer: Self-pay

## 2024-03-04 ENCOUNTER — Emergency Department

## 2024-03-04 DIAGNOSIS — B3731 Acute candidiasis of vulva and vagina: Secondary | ICD-10-CM | POA: Diagnosis not present

## 2024-03-04 DIAGNOSIS — G43809 Other migraine, not intractable, without status migrainosus: Secondary | ICD-10-CM | POA: Insufficient documentation

## 2024-03-04 DIAGNOSIS — R519 Headache, unspecified: Secondary | ICD-10-CM | POA: Diagnosis present

## 2024-03-04 HISTORY — DX: Unspecified osteoarthritis, unspecified site: M19.90

## 2024-03-04 LAB — CBC WITH DIFFERENTIAL/PLATELET
Abs Immature Granulocytes: 0.09 K/uL — ABNORMAL HIGH (ref 0.00–0.07)
Basophils Absolute: 0.1 K/uL (ref 0.0–0.1)
Basophils Relative: 1 %
Eosinophils Absolute: 0.1 K/uL (ref 0.0–0.5)
Eosinophils Relative: 1 %
HCT: 45.6 % (ref 36.0–46.0)
Hemoglobin: 15.4 g/dL — ABNORMAL HIGH (ref 12.0–15.0)
Immature Granulocytes: 1 %
Lymphocytes Relative: 31 %
Lymphs Abs: 3.2 K/uL (ref 0.7–4.0)
MCH: 29.1 pg (ref 26.0–34.0)
MCHC: 33.8 g/dL (ref 30.0–36.0)
MCV: 86.2 fL (ref 80.0–100.0)
Monocytes Absolute: 0.7 K/uL (ref 0.1–1.0)
Monocytes Relative: 7 %
Neutro Abs: 6.1 K/uL (ref 1.7–7.7)
Neutrophils Relative %: 59 %
Platelets: 186 K/uL (ref 150–400)
RBC: 5.29 MIL/uL — ABNORMAL HIGH (ref 3.87–5.11)
RDW: 13.3 % (ref 11.5–15.5)
WBC: 10.3 K/uL (ref 4.0–10.5)
nRBC: 0 % (ref 0.0–0.2)

## 2024-03-04 LAB — URINALYSIS, ROUTINE W REFLEX MICROSCOPIC
Bilirubin Urine: NEGATIVE
Glucose, UA: NEGATIVE mg/dL
Ketones, ur: NEGATIVE mg/dL
Nitrite: NEGATIVE
Protein, ur: NEGATIVE mg/dL
Specific Gravity, Urine: 1.004 — ABNORMAL LOW (ref 1.005–1.030)
pH: 8 (ref 5.0–8.0)

## 2024-03-04 LAB — COMPREHENSIVE METABOLIC PANEL WITH GFR
ALT: 33 U/L (ref 0–44)
AST: 20 U/L (ref 15–41)
Albumin: 4.3 g/dL (ref 3.5–5.0)
Alkaline Phosphatase: 62 U/L (ref 38–126)
Anion gap: 7 (ref 5–15)
BUN: 11 mg/dL (ref 6–20)
CO2: 29 mmol/L (ref 22–32)
Calcium: 9.3 mg/dL (ref 8.9–10.3)
Chloride: 103 mmol/L (ref 98–111)
Creatinine, Ser: 0.75 mg/dL (ref 0.44–1.00)
GFR, Estimated: >60 mL/min
Glucose, Bld: 90 mg/dL (ref 70–99)
Potassium: 4.1 mmol/L (ref 3.5–5.1)
Sodium: 139 mmol/L (ref 135–145)
Total Bilirubin: 0.4 mg/dL (ref 0.0–1.2)
Total Protein: 6.9 g/dL (ref 6.5–8.1)

## 2024-03-04 LAB — CHLAMYDIA/NGC RT PCR (ARMC ONLY)
Chlamydia Tr: NOT DETECTED
N gonorrhoeae: NOT DETECTED

## 2024-03-04 LAB — WET PREP, GENITAL
Clue Cells Wet Prep HPF POC: NONE SEEN
Sperm: NONE SEEN
Trich, Wet Prep: NONE SEEN
WBC, Wet Prep HPF POC: <10

## 2024-03-04 LAB — POC URINE PREG, ED: Preg Test, Ur: NEGATIVE

## 2024-03-04 MED ORDER — DIPHENHYDRAMINE HCL 50 MG/ML IJ SOLN
12.5000 mg | Freq: Once | INTRAMUSCULAR | Status: AC
  Administered 2024-03-04: 12.5 mg via INTRAVENOUS
  Filled 2024-03-04: qty 1

## 2024-03-04 MED ORDER — FLUCONAZOLE 150 MG PO TABS: 150.0000 mg | ORAL_TABLET | Freq: Every day | ORAL | 0 refills | Status: AC

## 2024-03-04 MED ORDER — DEXAMETHASONE SOD PHOSPHATE PF 10 MG/ML IJ SOLN
10.0000 mg | Freq: Once | INTRAMUSCULAR | Status: AC
  Administered 2024-03-04: 10 mg via INTRAVENOUS
  Filled 2024-03-04: qty 1

## 2024-03-04 MED ORDER — METOCLOPRAMIDE HCL 5 MG/ML IJ SOLN
10.0000 mg | Freq: Once | INTRAMUSCULAR | Status: AC
  Administered 2024-03-04: 10 mg via INTRAVENOUS
  Filled 2024-03-04: qty 2

## 2024-03-04 MED ORDER — SODIUM CHLORIDE 0.9 % IV BOLUS
1000.0000 mL | Freq: Once | INTRAVENOUS | Status: AC
  Administered 2024-03-04: 1000 mL via INTRAVENOUS

## 2024-03-04 MED ORDER — KETOROLAC TROMETHAMINE 15 MG/ML IJ SOLN
15.0000 mg | Freq: Once | INTRAMUSCULAR | Status: AC
  Administered 2024-03-04: 15 mg via INTRAVENOUS
  Filled 2024-03-04: qty 1

## 2024-03-04 NOTE — ED Provider Notes (Signed)
 "  [facility] Provider Note    Event Date/Time   First MD Initiated Contact with Patient 03/04/24 1500     (approximate)   History   Headache   HPI  Angela Levy is a 29 y.o. female with PMH of migraines, anxiety, POTS, scoliosis, arthritis, hysterectomy presents for evaluation of a headache.  Patient states that the headache began this morning.  She has never had a headache like this before.  She endorses associated nausea and light sensitivity but no visual changes.  Patient also endorses concerns for UTI as she has had some urinary frequency and left lower quadrant pain.  Patient took some Azo which seemed to decrease some of the symptoms but she would like to be evaluated for UTI.  She also endorses thick white abnormal vaginal discharge.      Physical Exam   Triage Vital Signs: ED Triage Vitals  Encounter Vitals Group     BP 03/04/24 1456 104/66     Girls Systolic BP Percentile --      Girls Diastolic BP Percentile --      Boys Systolic BP Percentile --      Boys Diastolic BP Percentile --      Pulse Rate 03/04/24 1456 79     Resp 03/04/24 1456 16     Temp 03/04/24 1456 97.9 F (36.6 C)     Temp Source 03/04/24 1456 Oral     SpO2 03/04/24 1456 100 %     Weight 03/04/24 1458 145 lb 1 oz (65.8 kg)     Height --      Head Circumference --      Peak Flow --      Pain Score 03/04/24 1457 7     Pain Loc --      Pain Education --      Exclude from Growth Chart --     Most recent vital signs: Vitals:   03/04/24 1456  BP: 104/66  Pulse: 79  Resp: 16  Temp: 97.9 F (36.6 C)  SpO2: 100%   General: Awake, no distress.  Laying in bed with the lights off. CV:  Good peripheral perfusion.  RRR. Resp:  Normal effort.  CTAB. Abd:  No distention.  Soft, mild tenderness to palpation in the left lower quadrant. Other:     ED Results / Procedures / Treatments   Labs (all labs ordered are listed, but only abnormal results are displayed) Labs  Reviewed  WET PREP, GENITAL - Abnormal; Notable for the following components:      Result Value   Yeast Wet Prep HPF POC PRESENT (*)    All other components within normal limits  CBC WITH DIFFERENTIAL/PLATELET - Abnormal; Notable for the following components:   RBC 5.29 (*)    Hemoglobin 15.4 (*)    Abs Immature Granulocytes 0.09 (*)    All other components within normal limits  URINALYSIS, ROUTINE W REFLEX MICROSCOPIC - Abnormal; Notable for the following components:   Color, Urine YELLOW (*)    APPearance CLEAR (*)    Specific Gravity, Urine 1.004 (*)    Hgb urine dipstick SMALL (*)    Leukocytes,Ua SMALL (*)    Bacteria, UA RARE (*)    All other components within normal limits  CHLAMYDIA/NGC RT PCR ([facility] ONLY)            COMPREHENSIVE METABOLIC PANEL WITH GFR  POC URINE PREG, ED     EKG   RADIOLOGY  CT head obtained, I interpreted the images as well as reviewed the radiologist report.  See ED course for interpretation.  PROCEDURES:  Critical Care performed: No  Procedures   MEDICATIONS ORDERED IN ED: Medications  sodium chloride  0.9 % bolus 1,000 mL (1,000 mLs Intravenous New Bag/Given 03/04/24 1618)  ketorolac  (TORADOL ) 15 MG/ML injection 15 mg (15 mg Intravenous Given 03/04/24 1615)  metoCLOPramide  (REGLAN ) injection 10 mg (10 mg Intravenous Given 03/04/24 1617)  diphenhydrAMINE  (BENADRYL ) injection 12.5 mg (12.5 mg Intravenous Given 03/04/24 1616)  dexamethasone  (DECADRON ) injection 10 mg (10 mg Intravenous Given 03/04/24 1615)     IMPRESSION / MDM / ASSESSMENT AND PLAN / ED COURSE  I reviewed the triage vital signs and the nursing notes.                             29 year old female presents for evaluation of headache, left lower quadrant pain, abnormal vaginal discharge and urinary frequency.  Vital signs are stable patient uncomfortable appearing on exam.  Differential diagnosis includes, but is not limited to, migraine, tension headache, intracranial bleed,  viral infection, UTI, pyelonephritis, nephrolithiasis, BV, yeast infection, STD.  Patient's presentation is most consistent with acute complicated illness / injury requiring diagnostic workup.  Given that patient reports her headache is different from anyone she has previously experienced will obtain CT head to rule out intracranial bleed although I have low suspicion for this.  Will plan to treat patient's symptoms with a migraine cocktail and reassess for improvement.  In regards to patient's left lower quadrant pain and concerns for UTI will obtain basic labs and urinalysis.  Patient also reported abnormal vaginal discharge so we will obtain cervical swabs  for yeast, BV, trichomoniasis, gonorrhea and chlamydia.  Patient did not endorse any concerns for HIV or syphilis.  Since patient does not endorse a known exposure to gonorrhea or chlamydia we will plan to have her follow-up with these results on MyChart.  Explained that she can seek treatment from PCP, urgent care or the ED if she test positive.  In regards to patient's left lower quadrant abdominal pain, blood work was reassuring.  Very low suspicion for ovarian torsion given patient's minimal pain.  Offered imaging and patient declined.  Suspect that this may be due to muscle strain or constipation.  Did discuss strict return precautions if she has any worsening symptoms like development of fever, bloody stools or vomiting.  Patient's headache improved after receiving medications.  Will send treatment for yeast infection.  Patient was given a note for work.  She voiced understanding, all questions were answered and she was stable at discharge.  Clinical Course as of 03/04/24 1743  Sat Mar 04, 2024  1621 CT Head Wo Contrast Negative for acute intracranial abnormalities. [LD]  1644 Wet prep, genital(!) Consistent with yeast infection.  Will treat with Diflucan . [LD]  1722 CBC with Differential(!) Unremarkable. [LD]  1723 Comprehensive  metabolic panel Unremarkable. [LD]  1723 POC urine preg, ED Negative. [LD]  1723 Urinalysis, Routine w reflex microscopic -Urine, Clean Catch(!) Small amount of leukocytes with rare bacteria, do not feel this is consistent with UTI. [LD]  1723 POC urine preg, ED Negative. [LD]  1736 Patient reassessed and all results reviewed with her.  Patient reports she is feeling much better after receiving the medication.  I did offer follow-up with neurology for management of migraines.  Patient states that her migraines are usually manageable.  She does not  like to take medication and is not interested in preventative or abortive medication at this point in time.  She would prefer to follow-up with her primary care provider. [LD]    Clinical Course User Index [LD] Cleaster Tinnie LABOR, PA-C     FINAL CLINICAL IMPRESSION(S) / ED DIAGNOSES   Final diagnoses:  Other migraine without status migrainosus, not intractable  Vaginal yeast infection     Rx / DC Orders   ED Discharge Orders          Ordered    fluconazole  (DIFLUCAN ) 150 MG tablet  Daily        03/04/24 1742             Note:  This document was prepared using Dragon voice recognition software and may include unintentional dictation errors.   Cleaster Tinnie LABOR, PA-C 03/04/24 1745  "

## 2024-03-04 NOTE — ED Triage Notes (Signed)
 Presents with  headache. Onset today.  Has history of migraines, but this headache feels different. Reports a concussion injury about one month ago.  Has not taken anything for pain today, c/o nausea today.

## 2024-03-04 NOTE — Discharge Instructions (Addendum)
 Your CT scan of your head, blood work and urinalysis was normal today.  You do have a yeast infection. I have sent a medication called Diflucan  to the pharmacy.  Please take 1 tablet today and then another in 3 days if you are still having symptoms.  At the time of discharge, your test for gonorrhea and chlamydia was still pending.  Please follow-up with these results on MyChart.  If you test positive for either of these you can seek treatment from your primary care provider, urgent care or the emergency department.
